# Patient Record
Sex: Female | Born: 1960 | Race: White | Hispanic: No | Marital: Married | Smoking: Never smoker
Health system: Southern US, Community
[De-identification: ages and names within clinical notes are randomized; demographics above are authoritative.]

## PROBLEM LIST (undated history)

## (undated) DIAGNOSIS — E785 Hyperlipidemia, unspecified: Secondary | ICD-10-CM

## (undated) DIAGNOSIS — K8020 Calculus of gallbladder without cholecystitis without obstruction: Secondary | ICD-10-CM

## (undated) DIAGNOSIS — K819 Cholecystitis, unspecified: Secondary | ICD-10-CM

## (undated) HISTORY — DX: Calculus of gallbladder without cholecystitis without obstruction: K80.20

## (undated) HISTORY — PX: CHOLECYSTECTOMY: SHX55

## (undated) HISTORY — DX: Hyperlipidemia, unspecified: E78.5

## (undated) HISTORY — DX: Cholecystitis, unspecified: K81.9

---

## 2006-01-18 ENCOUNTER — Ambulatory Visit: Payer: Self-pay

## 2007-03-14 ENCOUNTER — Ambulatory Visit: Payer: Self-pay

## 2008-03-17 ENCOUNTER — Ambulatory Visit: Payer: Self-pay

## 2008-07-16 ENCOUNTER — Ambulatory Visit: Payer: Self-pay | Admitting: Internal Medicine

## 2008-07-23 ENCOUNTER — Ambulatory Visit: Payer: Self-pay | Admitting: Internal Medicine

## 2008-08-27 ENCOUNTER — Ambulatory Visit: Payer: Self-pay | Admitting: Internal Medicine

## 2008-09-22 ENCOUNTER — Ambulatory Visit: Payer: Self-pay | Admitting: Internal Medicine

## 2009-03-18 ENCOUNTER — Ambulatory Visit: Payer: Self-pay

## 2010-03-22 ENCOUNTER — Ambulatory Visit: Payer: Self-pay

## 2010-09-13 ENCOUNTER — Emergency Department (HOSPITAL_BASED_OUTPATIENT_CLINIC_OR_DEPARTMENT_OTHER): Admission: EM | Admit: 2010-09-13 | Discharge: 2010-09-13 | Payer: Self-pay | Admitting: Emergency Medicine

## 2010-09-13 ENCOUNTER — Ambulatory Visit: Payer: Self-pay | Admitting: Diagnostic Radiology

## 2010-09-30 ENCOUNTER — Ambulatory Visit: Payer: Self-pay | Admitting: Internal Medicine

## 2010-10-31 ENCOUNTER — Ambulatory Visit: Payer: Self-pay | Admitting: Surgery

## 2010-11-07 ENCOUNTER — Ambulatory Visit: Payer: Self-pay | Admitting: Surgery

## 2010-11-08 LAB — PATHOLOGY REPORT

## 2011-01-03 LAB — BASIC METABOLIC PANEL
BUN: 15 mg/dL (ref 6–23)
CO2: 23 mEq/L (ref 19–32)
Calcium: 10.1 mg/dL (ref 8.4–10.5)
Chloride: 105 mEq/L (ref 96–112)
Creatinine, Ser: 0.9 mg/dL (ref 0.4–1.2)
GFR calc Af Amer: >60 mL/min (ref >60)
GFR calc non Af Amer: >60 mL/min (ref >60)
Glucose, Bld: 85 mg/dL (ref 70–99)
Potassium: 4.2 mEq/L (ref 3.5–5.1)
Sodium: 145 mEq/L (ref 135–145)

## 2011-01-03 LAB — POCT CARDIAC MARKERS
CKMB, poc: <1 ng/mL — ABNORMAL LOW (ref 1.0–8.0)
Myoglobin, poc: 63.8 ng/mL (ref 12–200)
Troponin i, poc: <0.05 ng/mL (ref 0.00–0.09)

## 2011-01-03 LAB — DIFFERENTIAL
Basophils Absolute: 0.1 10*3/uL (ref 0.0–0.1)
Basophils Relative: 1 % (ref 0–1)
Eosinophils Absolute: 0 10*3/uL (ref 0.0–0.7)
Eosinophils Relative: 1 % (ref 0–5)
Lymphocytes Relative: 27 % (ref 12–46)
Lymphs Abs: 1.9 10*3/uL (ref 0.7–4.0)
Monocytes Absolute: 0.3 10*3/uL (ref 0.1–1.0)
Monocytes Relative: 4 % (ref 3–12)
Neutro Abs: 4.7 10*3/uL (ref 1.7–7.7)
Neutrophils Relative %: 68 % (ref 43–77)

## 2011-01-03 LAB — CBC
HCT: 38.3 % (ref 36.0–46.0)
Hemoglobin: 13.7 g/dL (ref 12.0–15.0)
MCH: 30.1 pg (ref 26.0–34.0)
MCHC: 35.8 g/dL (ref 30.0–36.0)
MCV: 84.1 fL (ref 78.0–100.0)
Platelets: 319 10*3/uL (ref 150–400)
RBC: 4.56 MIL/uL (ref 3.87–5.11)
RDW: 11.7 % (ref 11.5–15.5)
WBC: 7 10*3/uL (ref 4.0–10.5)

## 2011-01-03 LAB — D-DIMER, QUANTITATIVE (NOT AT ARMC): D-Dimer, Quant: 0.29 ug/mL-FEU (ref 0.00–0.48)

## 2011-02-09 IMAGING — CR DG CHOLANGIOGRAM OPERATIVE
1 series · 2 of 2 positions shown · non-contrast
Comparison: none

REASON FOR EXAM: cholelithiasis
COMMENTS:

[Series 6002: chole 2 · 2 of 2 slices shown]
[im 1/2]
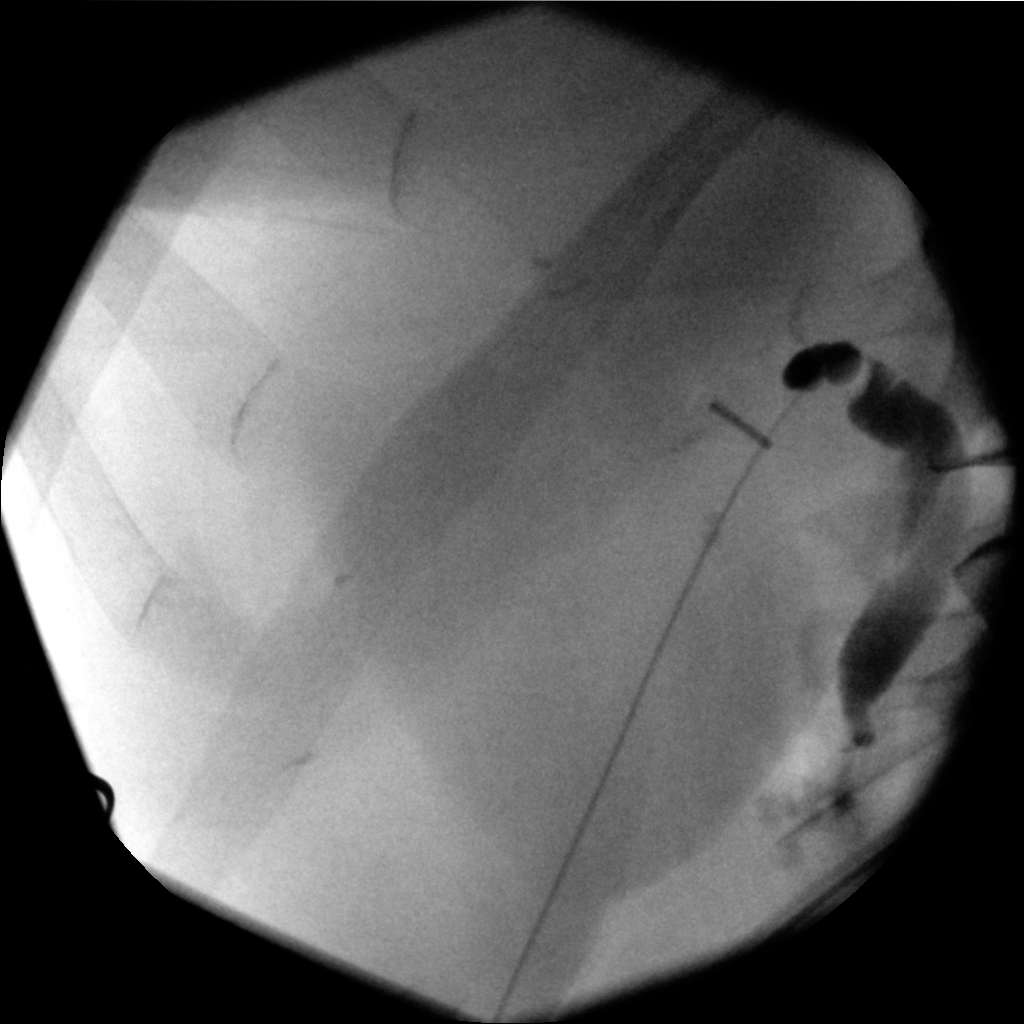
[im 2/2]
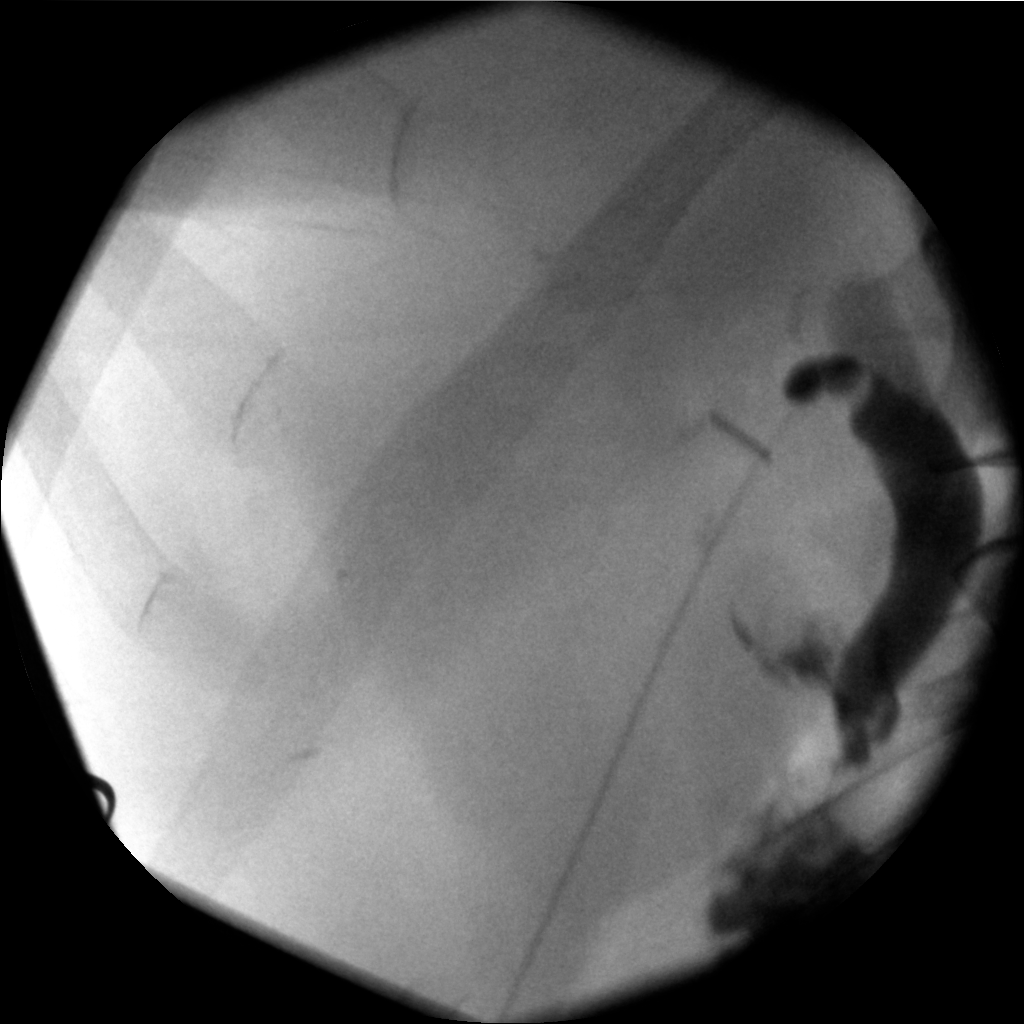

[2 of 2 positions shown; findings below may reference images not displayed]

PROCEDURE:     DXR - DXR CHOLANGIOGRAM OP (INITIAL)  - November 07, 2010  [DATE]

RESULT:     Four views of an operative cholangiogram are submitted. The
visualized portion of the common bile duct exhibits no filling defects. The
cystic duct remnant is grossly normal. I do not see the intrahepatic ducts.
There is contrast in the duodenum.
IMPRESSION: I do not see evidence of a retained common bile duct stone.

## 2011-03-22 ENCOUNTER — Ambulatory Visit: Payer: Self-pay | Admitting: Gastroenterology

## 2011-03-24 ENCOUNTER — Ambulatory Visit: Payer: Self-pay | Admitting: Internal Medicine

## 2011-09-18 ENCOUNTER — Ambulatory Visit: Payer: Self-pay | Admitting: Internal Medicine

## 2012-01-24 ENCOUNTER — Ambulatory Visit: Payer: Self-pay | Admitting: Internal Medicine

## 2012-04-22 ENCOUNTER — Ambulatory Visit: Payer: Self-pay | Admitting: Internal Medicine

## 2013-04-11 ENCOUNTER — Ambulatory Visit: Payer: Self-pay | Admitting: Internal Medicine

## 2013-05-13 ENCOUNTER — Ambulatory Visit: Payer: Self-pay

## 2014-06-08 ENCOUNTER — Ambulatory Visit: Payer: Self-pay

## 2016-06-21 ENCOUNTER — Other Ambulatory Visit: Payer: Self-pay | Admitting: Nurse Practitioner

## 2016-06-21 DIAGNOSIS — N63 Unspecified lump in unspecified breast: Secondary | ICD-10-CM

## 2016-06-21 DIAGNOSIS — Z1231 Encounter for screening mammogram for malignant neoplasm of breast: Secondary | ICD-10-CM

## 2016-06-22 ENCOUNTER — Other Ambulatory Visit: Payer: Self-pay | Admitting: *Deleted

## 2016-06-22 ENCOUNTER — Inpatient Hospital Stay
Admission: RE | Admit: 2016-06-22 | Discharge: 2016-06-22 | Disposition: A | Discharge status: Home / Self Care | Payer: Self-pay | Source: Ambulatory Visit | Attending: *Deleted | Admitting: *Deleted

## 2016-06-22 DIAGNOSIS — Z9289 Personal history of other medical treatment: Secondary | ICD-10-CM

## 2016-07-11 ENCOUNTER — Ambulatory Visit
Admission: RE | Admit: 2016-07-11 | Discharge: 2016-07-11 | Disposition: A | Discharge status: Home / Self Care | Payer: BLUE CROSS/BLUE SHIELD | Source: Ambulatory Visit | Attending: Nurse Practitioner | Admitting: Nurse Practitioner

## 2016-07-11 ENCOUNTER — Encounter: Payer: Self-pay | Admitting: Radiology

## 2016-07-11 DIAGNOSIS — Z1231 Encounter for screening mammogram for malignant neoplasm of breast: Secondary | ICD-10-CM

## 2016-07-11 DIAGNOSIS — N63 Unspecified lump in unspecified breast: Secondary | ICD-10-CM

## 2016-10-02 ENCOUNTER — Ambulatory Visit
Admission: RE | Admit: 2016-10-02 | Discharge: 2016-10-02 | Disposition: A | Discharge status: Home / Self Care | Payer: BLUE CROSS/BLUE SHIELD | Source: Ambulatory Visit | Attending: Nurse Practitioner | Admitting: Nurse Practitioner

## 2016-10-02 ENCOUNTER — Other Ambulatory Visit: Payer: Self-pay | Admitting: Nurse Practitioner

## 2016-10-02 DIAGNOSIS — M546 Pain in thoracic spine: Secondary | ICD-10-CM | POA: Insufficient documentation

## 2016-10-02 DIAGNOSIS — R52 Pain, unspecified: Secondary | ICD-10-CM

## 2016-10-30 DIAGNOSIS — M2550 Pain in unspecified joint: Secondary | ICD-10-CM | POA: Insufficient documentation

## 2016-10-30 DIAGNOSIS — R768 Other specified abnormal immunological findings in serum: Secondary | ICD-10-CM | POA: Insufficient documentation

## 2016-11-07 ENCOUNTER — Ambulatory Visit
Admission: RE | Admit: 2016-11-07 | Discharge: 2016-11-07 | Disposition: A | Discharge status: Home / Self Care | Payer: BLUE CROSS/BLUE SHIELD | Source: Ambulatory Visit | Attending: Nurse Practitioner | Admitting: Nurse Practitioner

## 2016-11-07 ENCOUNTER — Other Ambulatory Visit: Payer: Self-pay | Admitting: Nurse Practitioner

## 2016-11-07 DIAGNOSIS — R52 Pain, unspecified: Secondary | ICD-10-CM

## 2016-11-07 DIAGNOSIS — M545 Low back pain: Secondary | ICD-10-CM | POA: Diagnosis not present

## 2016-11-13 DIAGNOSIS — G8929 Other chronic pain: Secondary | ICD-10-CM | POA: Insufficient documentation

## 2016-11-13 DIAGNOSIS — M79645 Pain in left finger(s): Secondary | ICD-10-CM

## 2017-05-17 DIAGNOSIS — M1812 Unilateral primary osteoarthritis of first carpometacarpal joint, left hand: Secondary | ICD-10-CM | POA: Insufficient documentation

## 2017-07-05 DIAGNOSIS — M25532 Pain in left wrist: Secondary | ICD-10-CM

## 2017-07-05 DIAGNOSIS — G8929 Other chronic pain: Secondary | ICD-10-CM | POA: Insufficient documentation

## 2017-10-10 ENCOUNTER — Other Ambulatory Visit: Payer: Self-pay | Admitting: Nurse Practitioner

## 2017-10-10 DIAGNOSIS — F5101 Primary insomnia: Secondary | ICD-10-CM

## 2017-10-10 MED ORDER — ZOLPIDEM TARTRATE 10 MG PO TABS: 10.0000 mg | ORAL_TABLET | Freq: Every evening | ORAL | 2 refills | Status: DC | PRN

## 2017-10-10 NOTE — Progress Notes (Signed)
Refilled zolpidem with 2 refills and sent to her pharmacy.

## 2017-11-13 ENCOUNTER — Ambulatory Visit: Payer: Self-pay | Admitting: Nurse Practitioner

## 2017-12-25 ENCOUNTER — Encounter: Payer: Self-pay | Admitting: Nurse Practitioner

## 2017-12-25 ENCOUNTER — Ambulatory Visit: Payer: BLUE CROSS/BLUE SHIELD | Admitting: Nurse Practitioner

## 2017-12-25 VITALS — BP 122/78 | HR 88 | Resp 16 | Ht 70.0 in | Wt 195.8 lb

## 2017-12-25 DIAGNOSIS — Z1231 Encounter for screening mammogram for malignant neoplasm of breast: Secondary | ICD-10-CM

## 2017-12-25 DIAGNOSIS — F5101 Primary insomnia: Secondary | ICD-10-CM | POA: Diagnosis not present

## 2017-12-25 DIAGNOSIS — E782 Mixed hyperlipidemia: Secondary | ICD-10-CM

## 2017-12-25 DIAGNOSIS — M5000 Cervical disc disorder with myelopathy, unspecified cervical region: Secondary | ICD-10-CM | POA: Diagnosis not present

## 2017-12-25 DIAGNOSIS — Z1239 Encounter for other screening for malignant neoplasm of breast: Secondary | ICD-10-CM

## 2017-12-25 MED ORDER — ZOLPIDEM TARTRATE 10 MG PO TABS: 10.0000 mg | ORAL_TABLET | Freq: Every evening | ORAL | 3 refills | Status: DC | PRN

## 2017-12-25 NOTE — Progress Notes (Signed)
[facility] [address] [place], [place] [address]  Internal MEDICINE  Office Visit Note  Patient Name: Penny HeftyDebora A Hiemstra  [id number]  [id number]  Date of Service: 12/25/2017  Chief Complaint  Patient presents with  . Medication Refill    The patient is here for routine follow up exam. She has trouble with sleeping, intermittently. Will take ambien as needed when she has issues with sleep. She does need a refill of this today.  She continues to suffer from neck pain. She gets a massage every other week to control the pain. Has been on vacation this past week and the pain is well controlled. When needed, she does take anti-inflammatory or occasionally, se will take hydrocodone/APAP 5/325mg  tablets if needed. This is generally after long hours working.    Pt is here for routine follow up.    Current Medication: Outpatient Encounter Medications as of 12/25/2017  Medication Sig  . Calcium Citrate 200 MG TABS Take by mouth.  . cyclobenzaprine (FLEXERIL) 10 MG tablet Take by mouth.  . estrogen, conjugated,-medroxyprogesterone (PREMPRO) 0.3-1.5 MG tablet   . furosemide (LASIX) 20 MG tablet TAKE 1 DAILY AS NEEDED  . HYDROcodone-acetaminophen (NORCO/VICODIN) 5-325 MG tablet Take 1 tablet by mouth every 6 (six) hours as needed for moderate pain.  Marland Kitchen. ibuprofen (ADVIL,MOTRIN) 200 MG tablet Take by mouth.  . Multiple Vitamin (MULTI-VITAMINS) TABS Take by mouth.  Marland Kitchen. omeprazole (PRILOSEC) 20 MG capsule Take by mouth.  . Phendimetrazine Tartrate 105 MG CP24 Take by mouth.  . pravastatin (PRAVACHOL) 20 MG tablet Take by mouth.  . zolpidem (AMBIEN) 10 MG tablet Take 1 tablet (10 mg total) by mouth at bedtime as needed for sleep.  . [DISCONTINUED] zolpidem (AMBIEN) 10 MG tablet Take 1 tablet (10 mg total) by mouth at bedtime as needed for sleep.   No facility-administered encounter medications on file as of 12/25/2017.     Surgical History: Past Surgical History:  Procedure Laterality  Date  . CHOLECYSTECTOMY      Medical History: Past Medical History:  Diagnosis Date  . Cholecystitis   . Cholelithiasis   . Hyperlipidemia     Family History: Family History  Problem Relation Age of Onset  . Thyroid disease Mother   . Hyperlipidemia Father   . Heart disease Father   . Thyroid disease Maternal Grandmother     Social History   Socioeconomic History  . Marital status: Married    Spouse name: Not on file  . Number of children: Not on file  . Years of education: Not on file  . Highest education level: Not on file  Social Needs  . Financial resource strain: Not on file  . Food insecurity - worry: Not on file  . Food insecurity - inability: Not on file  . Transportation needs - medical: Not on file  . Transportation needs - non-medical: Not on file  Occupational History  . Not on file  Tobacco Use  . Smoking status: Never Smoker  . Smokeless tobacco: Never Used  Substance and Sexual Activity  . Alcohol use: No    Frequency: Never  . Drug use: No  . Sexual activity: Not on file  Other Topics Concern  . Not on file  Social History Narrative  . Not on file      Review of Systems  Constitutional: Negative for activity change, chills, fatigue and unexpected weight change.  HENT: Negative for congestion, postnasal drip, rhinorrhea, sneezing and sore throat.   Eyes: Negative.  Negative for redness.  Respiratory: Negative for cough, chest tightness and shortness of breath.   Cardiovascular: Negative for chest pain and palpitations.  Gastrointestinal: Negative for abdominal pain, constipation, diarrhea, nausea and vomiting.  Endocrine: Negative for cold intolerance, heat intolerance, polydipsia, polyphagia and polyuria.  Genitourinary: Negative for dysuria and frequency.  Musculoskeletal: Positive for neck pain. Negative for arthralgias, back pain and joint swelling.  Skin: Negative for rash.  Neurological: Negative.  Negative for tremors and numbness.   Hematological: Negative for adenopathy. Does not bruise/bleed easily.  Psychiatric/Behavioral: Positive for sleep disturbance. Negative for behavioral problems (Depression) and suicidal ideas. The patient is not nervous/anxious.     Today's Vitals   12/25/17 1020  BP: 122/78  Pulse: 88  Resp: 16  SpO2: 99%  Weight: 195 lb 12.8 oz (88.8 kg)  Height: 5\' 10"  (1.778 m)    Physical Exam  Constitutional: She is oriented to person, place, and time. She appears well-developed and well-nourished. No distress.  HENT:  Head: Normocephalic and atraumatic.  Mouth/Throat: Oropharynx is clear and moist. No oropharyngeal exudate.  Eyes: Pupils are equal, round, and reactive to light. EOM are normal.  Neck: Normal range of motion. Neck supple. No JVD present. No tracheal deviation present. No thyromegaly present.  Cardiovascular: Normal rate, regular rhythm and normal heart sounds. Exam reveals no gallop and no friction rub.  No murmur heard. Pulmonary/Chest: Effort normal and breath sounds normal. No respiratory distress. She has no wheezes. She has no rales. She exhibits no tenderness.  Abdominal: Soft. Bowel sounds are normal. There is no tenderness.  Musculoskeletal: Normal range of motion.  Lymphadenopathy:    She has no cervical adenopathy.  Neurological: She is alert and oriented to person, place, and time. No cranial nerve deficit.  Skin: Skin is warm and dry. She is not diaphoretic.  Psychiatric: She has a normal mood and affect. Her behavior is normal. Judgment and thought content normal.  Nursing note and vitals reviewed.  Assessment/Plan: 1. Cervical disc disease with myelopathy Overal, doing well. Continue to take NSAIDs as needed and as indicated to control pain and inflammation. May continue to take hydrocodone/APAP 5/325mg  tablets at night if needed for severe pain.   2. Primary insomnia - zolpidem (AMBIEN) 10 MG tablet; Take 1 tablet (10 mg total) by mouth at bedtime as needed  for sleep.  Dispense: 30 tablet; Refill: 3  3. Mixed hyperlipidemia Stable. Continue pravastatin as prescribed.   4. Screening for breast cancer - MM DIGITAL SCREENING BILATERAL; Future  General Counseling: Joslynne verbalizes understanding of the findings of todays visit and agrees with plan of treatment. I have discussed any further diagnostic evaluation that may be needed or ordered today. We also reviewed her medications today. she has been encouraged to call the office with any questions or concerns that should arise related to todays visit.   This patient was seen by Vincent Gros, FNP- C in Collaboration with Dr Lyndon Code as a part of collaborative care agreement    Orders Placed This Encounter  Procedures  . MM DIGITAL SCREENING BILATERAL    Meds ordered this encounter  Medications  . zolpidem (AMBIEN) 10 MG tablet    Sig: Take 1 tablet (10 mg total) by mouth at bedtime as needed for sleep.    Dispense:  30 tablet    Refill:  3    Order Specific Question:   Supervising Provider    Answer:   Lyndon Code [[id number]]    Time  spent: 26  Minutes     Dr Lyndon Code Internal medicine

## 2018-01-07 ENCOUNTER — Telehealth: Payer: Self-pay

## 2018-01-07 NOTE — Telephone Encounter (Signed)
[facility] [place] sent [place] fax to refill pt zolpidem 10 mg tab, but rx was already sent 12/25/17. Pharmacy was called and notified and the mistake was on their end. The refill was put under Penny Hensley. Spoke to a representative and she said it will be fixed.

## 2018-01-13 DIAGNOSIS — Z0001 Encounter for general adult medical examination with abnormal findings: Secondary | ICD-10-CM | POA: Insufficient documentation

## 2018-01-13 DIAGNOSIS — F5101 Primary insomnia: Secondary | ICD-10-CM | POA: Insufficient documentation

## 2018-01-13 DIAGNOSIS — M5000 Cervical disc disorder with myelopathy, unspecified cervical region: Secondary | ICD-10-CM | POA: Insufficient documentation

## 2018-01-13 DIAGNOSIS — E782 Mixed hyperlipidemia: Secondary | ICD-10-CM | POA: Insufficient documentation

## 2018-02-25 ENCOUNTER — Other Ambulatory Visit: Payer: Self-pay | Admitting: Sports Medicine

## 2018-02-25 DIAGNOSIS — M25532 Pain in left wrist: Principal | ICD-10-CM

## 2018-02-25 DIAGNOSIS — G8929 Other chronic pain: Secondary | ICD-10-CM

## 2018-03-01 ENCOUNTER — Ambulatory Visit: Payer: BLUE CROSS/BLUE SHIELD

## 2018-03-05 ENCOUNTER — Ambulatory Visit: Payer: BLUE CROSS/BLUE SHIELD

## 2018-03-15 ENCOUNTER — Ambulatory Visit
Admission: RE | Admit: 2018-03-15 | Discharge: 2018-03-15 | Disposition: A | Discharge status: Home / Self Care | Payer: BLUE CROSS/BLUE SHIELD | Source: Ambulatory Visit | Attending: Sports Medicine | Admitting: Sports Medicine

## 2018-03-15 ENCOUNTER — Ambulatory Visit: Payer: BLUE CROSS/BLUE SHIELD

## 2018-03-15 DIAGNOSIS — M24132 Other articular cartilage disorders, left wrist: Secondary | ICD-10-CM | POA: Insufficient documentation

## 2018-03-15 DIAGNOSIS — M25532 Pain in left wrist: Secondary | ICD-10-CM | POA: Diagnosis present

## 2018-03-15 DIAGNOSIS — G8929 Other chronic pain: Secondary | ICD-10-CM | POA: Insufficient documentation

## 2018-03-15 MED ORDER — LIDOCAINE HCL (PF) 1 % IJ SOLN
5.0000 mL | Freq: Once | INTRAMUSCULAR | Status: AC
  Administered 2018-03-15: 5 mL

## 2018-03-15 MED ORDER — IOPAMIDOL (ISOVUE-300) INJECTION 61%
10.0000 mL | Freq: Once | INTRAVENOUS | Status: AC | PRN
  Administered 2018-03-15: 1.5 mL

## 2018-03-15 MED ORDER — GADOBENATE DIMEGLUMINE 529 MG/ML IV SOLN
5.0000 mL | Freq: Once | INTRAVENOUS | Status: AC | PRN
  Administered 2018-03-15: 0.05 mL via INTRA_ARTICULAR

## 2018-04-15 DIAGNOSIS — M24132 Other articular cartilage disorders, left wrist: Secondary | ICD-10-CM | POA: Insufficient documentation

## 2018-04-15 DIAGNOSIS — M778 Other enthesopathies, not elsewhere classified: Secondary | ICD-10-CM | POA: Insufficient documentation

## 2018-04-29 ENCOUNTER — Encounter (INDEPENDENT_AMBULATORY_CARE_PROVIDER_SITE_OTHER): Payer: Self-pay

## 2018-04-29 ENCOUNTER — Encounter: Payer: Self-pay | Admitting: Nurse Practitioner

## 2018-04-29 ENCOUNTER — Ambulatory Visit: Payer: Self-pay | Admitting: Nurse Practitioner

## 2018-04-29 VITALS — BP 122/72 | HR 64 | Resp 16 | Ht 70.0 in | Wt 200.6 lb

## 2018-04-29 DIAGNOSIS — E669 Obesity, unspecified: Secondary | ICD-10-CM | POA: Insufficient documentation

## 2018-04-29 DIAGNOSIS — M5000 Cervical disc disorder with myelopathy, unspecified cervical region: Secondary | ICD-10-CM

## 2018-04-29 DIAGNOSIS — F5101 Primary insomnia: Secondary | ICD-10-CM

## 2018-04-29 DIAGNOSIS — E782 Mixed hyperlipidemia: Secondary | ICD-10-CM

## 2018-04-29 MED ORDER — PHENDIMETRAZINE TARTRATE ER 105 MG PO CP24: 105.0000 mg | ORAL_CAPSULE | Freq: Every day | ORAL | 2 refills | Status: DC

## 2018-04-29 MED ORDER — ZOLPIDEM TARTRATE 10 MG PO TABS: 10.0000 mg | ORAL_TABLET | Freq: Every evening | ORAL | 3 refills | Status: DC | PRN

## 2018-04-29 NOTE — Progress Notes (Signed)
[facility] [address] [place], [place] [address]  Internal MEDICINE  Office Visit Note  Patient Name: Penny Hensley  [id number]  [id number]  Date of Service: 04/29/2018  Chief Complaint  Patient presents with  . MYELOPATHY    4 month follow up    The patient has continued left wrist. Has seen orthopedics. Will need to have surgery to repair torn ligament in the wrist. Also continues to have neck pain along the right side of the neck. Does see massage therapist every other week. This is helping to control the pain and muscle aches. Has not had to use any pain medications other than ibuprofen in several months.  Has gained about 5 pounds since her last visit. Has been off appetite suppressant for some time. Just came off vacation. Would liek to restart Bontril to help get the new weight off.    Pt is here for routine follow up.    Current Medication: Outpatient Encounter Medications as of 04/29/2018  Medication Sig  . Calcium Citrate 200 MG TABS Take by mouth.  . cyclobenzaprine (FLEXERIL) 10 MG tablet Take by mouth.  . estrogen, conjugated,-medroxyprogesterone (PREMPRO) 0.3-1.5 MG tablet   . furosemide (LASIX) 20 MG tablet TAKE 1 DAILY AS NEEDED  . HYDROcodone-acetaminophen (NORCO/VICODIN) 5-325 MG tablet Take 1 tablet by mouth every 6 (six) hours as needed for moderate pain.  Marland Kitchen ibuprofen (ADVIL,MOTRIN) 200 MG tablet Take by mouth.  . Multiple Vitamin (MULTI-VITAMINS) TABS Take by mouth.  Marland Kitchen omeprazole (PRILOSEC) 20 MG capsule Take by mouth.  . Phendimetrazine Tartrate 105 MG CP24 Take 1 capsule (105 mg total) by mouth daily.  . pravastatin (PRAVACHOL) 20 MG tablet Take by mouth.  . [DISCONTINUED] Phendimetrazine Tartrate 105 MG CP24 Take by mouth.  . zolpidem (AMBIEN) 10 MG tablet Take 1 tablet (10 mg total) by mouth at bedtime as needed for sleep.  . [DISCONTINUED] zolpidem (AMBIEN) 10 MG tablet Take 1 tablet (10 mg total) by mouth at bedtime as needed for  sleep.   No facility-administered encounter medications on file as of 04/29/2018.     Surgical History: Past Surgical History:  Procedure Laterality Date  . CHOLECYSTECTOMY      Medical History: Past Medical History:  Diagnosis Date  . Cholecystitis   . Cholelithiasis   . Hyperlipidemia     Family History: Family History  Problem Relation Age of Onset  . Thyroid disease Mother   . Hyperlipidemia Father   . Heart disease Father   . Thyroid disease Maternal Grandmother     Social History   Socioeconomic History  . Marital status: Married    Spouse name: Not on file  . Number of children: Not on file  . Years of education: Not on file  . Highest education level: Not on file  Occupational History  . Not on file  Social Needs  . Financial resource strain: Not on file  . Food insecurity:    Worry: Not on file    Inability: Not on file  . Transportation needs:    Medical: Not on file    Non-medical: Not on file  Tobacco Use  . Smoking status: Never Smoker  . Smokeless tobacco: Never Used  Substance and Sexual Activity  . Alcohol use: No    Frequency: Never  . Drug use: No  . Sexual activity: Not on file  Lifestyle  . Physical activity:    Days per week: Not on file    Minutes per session:  Not on file  . Stress: Not on file  Relationships  . Social connections:    Talks on phone: Not on file    Gets together: Not on file    Attends religious service: Not on file    Active member of club or organization: Not on file    Attends meetings of clubs or organizations: Not on file    Relationship status: Not on file  . Intimate partner violence:    Fear of current or ex partner: Not on file    Emotionally abused: Not on file    Physically abused: Not on file    Forced sexual activity: Not on file  Other Topics Concern  . Not on file  Social History Narrative  . Not on file      Review of Systems  Constitutional: Positive for unexpected weight change.  Negative for activity change, chills and fatigue.       Five pound weight gain since last visit.   HENT: Negative for congestion, postnasal drip, rhinorrhea, sneezing and sore throat.   Eyes: Negative.  Negative for redness.  Respiratory: Negative for cough, chest tightness, shortness of breath and wheezing.   Cardiovascular: Negative for chest pain and palpitations.  Gastrointestinal: Negative for abdominal pain, constipation, diarrhea, nausea and vomiting.  Endocrine: Negative for cold intolerance, heat intolerance, polydipsia, polyphagia and polyuria.  Genitourinary: Negative for dysuria and frequency.  Musculoskeletal: Positive for arthralgias, neck pain and neck stiffness. Negative for back pain and joint swelling.       Left wrist pain.   Skin: Negative for rash.  Allergic/Immunologic: Negative for environmental allergies.  Neurological: Negative for dizziness, tremors, numbness and headaches.  Hematological: Negative for adenopathy. Does not bruise/bleed easily.  Psychiatric/Behavioral: Positive for sleep disturbance. Negative for behavioral problems (Depression) and suicidal ideas. The patient is not nervous/anxious.     Today's Vitals   04/29/18 0907  BP: 122/72  Pulse: 64  Resp: 16  SpO2: 100%  Weight: 200 lb 9.6 oz (91 kg)  Height: 5\' 10"  (1.778 m)    Physical Exam  Constitutional: She is oriented to person, place, and time. She appears well-developed and well-nourished. No distress.  HENT:  Head: Normocephalic and atraumatic.  Mouth/Throat: Oropharynx is clear and moist. No oropharyngeal exudate.  Eyes: Pupils are equal, round, and reactive to light. EOM are normal.  Neck: Normal range of motion and full passive range of motion without pain. Neck supple. No JVD present. No tracheal deviation present. No thyromegaly present.  Cardiovascular: Normal rate, regular rhythm and normal heart sounds. Exam reveals no gallop and no friction rub.  No murmur  heard. Pulmonary/Chest: Effort normal and breath sounds normal. No respiratory distress. She has no wheezes. She has no rales. She exhibits no tenderness.  Abdominal: Soft. Bowel sounds are normal. There is no tenderness.  Musculoskeletal:  Limited ROM and strength of left wrist. Mild swelling present.   Lymphadenopathy:    She has no cervical adenopathy.  Neurological: She is alert and oriented to person, place, and time. No cranial nerve deficit.  Skin: Skin is warm and dry. She is not diaphoretic.  Psychiatric: She has a normal mood and affect. Her behavior is normal. Judgment and thought content normal.  Nursing note and vitals reviewed.  Assessment/Plan: 1. Mixed hyperlipidemia Fasting lipid panel to be checked prior to next visit.   2. Cervical disc disease with myelopathy Continue regular visits with massage therapy every 2 weeks. NSAIDs as needed.   3. Primary  insomnia - zolpidem (AMBIEN) 10 MG tablet; Take 1 tablet (10 mg total) by mouth at bedtime as needed for sleep.  Dispense: 30 tablet; Refill: 3  4. Mild obesity Restart bontril SR 105mg  daily. Reviewed importance of low calorie diet and regular exercise while trying to lose weight.  - Phendimetrazine Tartrate 105 MG CP24; Take 1 capsule (105 mg total) by mouth daily.  Dispense: 30 each; Refill: 2  General Counseling: Landis verbalizes understanding of the findings of todays visit and agrees with plan of treatment. I have discussed any further diagnostic evaluation that may be needed or ordered today. We also reviewed her medications today. she has been encouraged to call the office with any questions or concerns that should arise related to todays visit.    Counseling:   There is a liability release in patients' chart. There has been a 10 minute discussion about the side effects including but not limited to elevated blood pressure, anxiety, lack of sleep and dry mouth. Pt understands and will like to start/continue on  appetite suppressant at this time. There will be one month RX given at the time of visit with proper follow up. Nova diet plan with restricted calories is given to the pt. Pt understands and agrees with  plan of treatment  This patient was seen by Vincent Gros, FNP- C in Collaboration with Dr Lyndon Code as a part of collaborative care agreement    Meds ordered this encounter  Medications  . zolpidem (AMBIEN) 10 MG tablet    Sig: Take 1 tablet (10 mg total) by mouth at bedtime as needed for sleep.    Dispense:  30 tablet    Refill:  3    Order Specific Question:   Supervising Provider    Answer:   Lyndon Code [[id number]]  . Phendimetrazine Tartrate 105 MG CP24    Sig: Take 1 capsule (105 mg total) by mouth daily.    Dispense:  30 each    Refill:  2    Order Specific Question:   Supervising Provider    Answer:   Lyndon Code [[id number]]    Time spent: 61 Minutes      Dr Lyndon Code Internal medicine

## 2018-05-20 DIAGNOSIS — R200 Anesthesia of skin: Secondary | ICD-10-CM | POA: Insufficient documentation

## 2018-05-20 DIAGNOSIS — R2241 Localized swelling, mass and lump, right lower limb: Secondary | ICD-10-CM | POA: Insufficient documentation

## 2018-05-26 ENCOUNTER — Other Ambulatory Visit: Payer: Self-pay | Admitting: Internal Medicine

## 2018-06-02 ENCOUNTER — Other Ambulatory Visit: Payer: Self-pay | Admitting: Internal Medicine

## 2018-09-03 ENCOUNTER — Encounter: Payer: Self-pay | Admitting: Nurse Practitioner

## 2018-10-30 ENCOUNTER — Encounter: Payer: Self-pay | Admitting: Nurse Practitioner

## 2018-10-31 ENCOUNTER — Encounter: Payer: Self-pay | Admitting: Nurse Practitioner

## 2018-10-31 ENCOUNTER — Other Ambulatory Visit: Payer: Self-pay

## 2018-10-31 ENCOUNTER — Ambulatory Visit (INDEPENDENT_AMBULATORY_CARE_PROVIDER_SITE_OTHER): Payer: BC Managed Care – PPO | Admitting: Nurse Practitioner

## 2018-10-31 VITALS — BP 120/78 | HR 64 | Resp 16 | Ht 70.0 in | Wt 176.2 lb

## 2018-10-31 DIAGNOSIS — R300 Dysuria: Secondary | ICD-10-CM

## 2018-10-31 DIAGNOSIS — M5000 Cervical disc disorder with myelopathy, unspecified cervical region: Secondary | ICD-10-CM

## 2018-10-31 DIAGNOSIS — R600 Localized edema: Secondary | ICD-10-CM

## 2018-10-31 DIAGNOSIS — Z0001 Encounter for general adult medical examination with abnormal findings: Secondary | ICD-10-CM

## 2018-10-31 DIAGNOSIS — N281 Cyst of kidney, acquired: Secondary | ICD-10-CM

## 2018-10-31 MED ORDER — IBUPROFEN 200 MG PO TABS: 800.0000 mg | ORAL_TABLET | Freq: Three times a day (TID) | ORAL | 3 refills | Status: DC | PRN

## 2018-10-31 MED ORDER — FUROSEMIDE 20 MG PO TABS: 20.0000 mg | ORAL_TABLET | ORAL | 5 refills | Status: DC | PRN

## 2018-10-31 MED ORDER — FUROSEMIDE 20 MG PO TABS: 20.0000 mg | ORAL_TABLET | Freq: Every day | ORAL | 5 refills | Status: DC | PRN

## 2018-10-31 NOTE — Progress Notes (Signed)
[facility] [address] [place], [place] [address]  Internal MEDICINE  Office Visit Note  Patient Name: Penny HeftyDebora A Hensley  [id number] 16, 2062  [id number]  Date of Service: 11/02/2018   Pt is here for routine health maintenance examination   Chief Complaint  Patient presents with  . Annual Exam    pt has cyst on kidney it has been bothering her  . Numbness    still have numbness in her toes  . Quality Metric Gaps    pt stated she got her mammogram done march 2019     The patient is here for routine health maintenance exam. She is having worsening right flank pain, especially with exertion or standing and sitting still for long periods of time. She does have a small renal cyst on right kidney, and this is exact spot where she is having pain. This started getting worse in October and November and has continued to worsen. States that by the end of the day, it hurts her to move or to sit. After she goes to bed and wakes up, the pain is completely gone. Currently, has no pain. She also has continued to have numbness in her toes. Numbness is in the very tips of her toes and started last year or the year before. Has not really changed. She does not wear high heels. Generally wears tennis shoes with arch supports to work.    Current Medication: Outpatient Encounter Medications as of 10/31/2018  Medication Sig  . B Complex Vitamins (B COMPLEX PO) Take by mouth.  . fluocinonide cream (LIDEX) 0.05 % Apply 1 application topically 2 (two) times daily.  Marland Kitchen. ibuprofen (ADVIL,MOTRIN) 200 MG tablet Take 4 tablets (800 mg total) by mouth every 8 (eight) hours as needed.  . Multiple Vitamin (MULTI-VITAMINS) TABS Take by mouth.  Marland Kitchen. omeprazole (PRILOSEC) 20 MG capsule TAKE ONE CAPSULE BY MOUTH EVERY DAY  . pravastatin (PRAVACHOL) 20 MG tablet TAKE 1 TABLET BY MOUTH EVERY DAY  . progesterone (PROMETRIUM) 100 MG capsule Take 100 mg by mouth daily.  . [DISCONTINUED] furosemide (LASIX) 20 MG tablet  TAKE 1 DAILY AS NEEDED  . [DISCONTINUED] furosemide (LASIX) 20 MG tablet Take 1 tablet (20 mg total) by mouth as needed.  . [DISCONTINUED] ibuprofen (ADVIL,MOTRIN) 200 MG tablet Take 800 mg by mouth as needed.   . Calcium Citrate 200 MG TABS Take by mouth.  . cyclobenzaprine (FLEXERIL) 10 MG tablet Take by mouth.  . estrogen, conjugated,-medroxyprogesterone (PREMPRO) 0.3-1.5 MG tablet   . [DISCONTINUED] HYDROcodone-acetaminophen (NORCO/VICODIN) 5-325 MG tablet Take 1 tablet by mouth every 6 (six) hours as needed for moderate pain.  . [DISCONTINUED] Phendimetrazine Tartrate 105 MG CP24 Take 1 capsule (105 mg total) by mouth daily. (Patient not taking: Reported on 10/31/2018)  . [DISCONTINUED] zolpidem (AMBIEN) 10 MG tablet Take 1 tablet (10 mg total) by mouth at bedtime as needed for sleep.   No facility-administered encounter medications on file as of 10/31/2018.     Surgical History: Past Surgical History:  Procedure Laterality Date  . CHOLECYSTECTOMY      Medical History: Past Medical History:  Diagnosis Date  . Cholecystitis   . Cholelithiasis   . Hyperlipidemia     Family History: Family History  Problem Relation Age of Onset  . Thyroid disease Mother   . Hyperlipidemia Father   . Heart disease Father   . Thyroid disease Maternal Grandmother       Review of Systems  Constitutional: Negative for chills, fatigue and  unexpected weight change.       Patient has had 24 pound weight loss since her last visit. [facility] and taking Hcg shots to help with weight loss. Is doing well.   HENT: Negative for congestion, postnasal drip, rhinorrhea, sneezing and sore throat.   Respiratory: Negative for cough, chest tightness, shortness of breath and wheezing.   Cardiovascular: Negative for chest pain and palpitations.  Gastrointestinal: Negative for abdominal pain, constipation, diarrhea, nausea and vomiting.  Endocrine: Negative for cold intolerance, heat intolerance,  polydipsia and polyuria.  Genitourinary: Positive for flank pain. Negative for dysuria, frequency and urgency.       History of right renal cyst.   Musculoskeletal: Negative for arthralgias, back pain, joint swelling and neck pain.  Skin: Negative for rash.  Allergic/Immunologic: Negative for environmental allergies.  Neurological: Negative for dizziness, tremors, numbness and headaches.  Hematological: Negative for adenopathy. Does not bruise/bleed easily.  Psychiatric/Behavioral: Negative for behavioral problems (Depression), dysphoric mood, sleep disturbance and suicidal ideas. The patient is not nervous/anxious.      Today's Vitals   10/31/18 1015  BP: 120/78  Pulse: 64  Resp: 16  SpO2: 98%  Weight: 176 lb 3.2 oz (79.9 kg)  Height: 5\' 10"  (1.778 m)    Physical Exam Vitals signs and nursing note reviewed.  Constitutional:      General: She is not in acute distress.    Appearance: Normal appearance. She is well-developed. She is not diaphoretic.  HENT:     Head: Normocephalic and atraumatic.     Nose: Nose normal.     Mouth/Throat:     Pharynx: No oropharyngeal exudate.  Eyes:     Extraocular Movements: Extraocular movements intact.     Conjunctiva/sclera: Conjunctivae normal.     Pupils: Pupils are equal, round, and reactive to light.  Neck:     Musculoskeletal: Normal range of motion and neck supple. Muscular tenderness present.     Thyroid: No thyromegaly.     Vascular: No JVD.     Trachea: No tracheal deviation.  Cardiovascular:     Rate and Rhythm: Normal rate and regular rhythm.     Pulses: Normal pulses.     Heart sounds: Normal heart sounds. No murmur. No friction rub. No gallop.   Pulmonary:     Effort: Pulmonary effort is normal. No respiratory distress.     Breath sounds: Normal breath sounds. No wheezing or rales.  Chest:     Chest wall: No tenderness.     Breasts:        Right: Normal. No swelling, bleeding, inverted nipple, mass, nipple discharge,  skin change or tenderness.        Left: Normal. No swelling, bleeding, inverted nipple, mass, nipple discharge, skin change or tenderness.  Abdominal:     General: Abdomen is flat. Bowel sounds are normal.     Palpations: Abdomen is soft.  Genitourinary:    Comments: No CVA tenderness with palpation. Musculoskeletal: Normal range of motion.  Lymphadenopathy:     Cervical: No cervical adenopathy.  Skin:    General: Skin is warm and dry.     Capillary Refill: Capillary refill takes less than 2 seconds.  Neurological:     Mental Status: She is alert and oriented to person, place, and time.     Cranial Nerves: No cranial nerve deficit.  Psychiatric:        Mood and Affect: Mood normal.        Behavior:  Behavior normal.        Thought Content: Thought content normal.        Judgment: Judgment normal.      LABS: Recent Results (from the past 2160 hour(s))  UA/M w/rflx Culture, Routine     Status: Abnormal   Collection Time: 10/31/18 10:34 AM  Result Value Ref Range   Specific Gravity, UA 1.022 1.005 - 1.030   pH, UA 6.5 5.0 - 7.5   Color, UA Yellow Yellow   Appearance Ur Clear Clear   Leukocytes, UA Trace (A) Negative   Protein, UA Negative Negative/Trace   Glucose, UA Negative Negative   Ketones, UA Negative Negative   RBC, UA Negative Negative   Bilirubin, UA Negative Negative   Urobilinogen, Ur 1.0 0.2 - 1.0 mg/dL   Nitrite, UA Negative Negative   Microscopic Examination See below:     Comment: Microscopic was indicated and was performed.   Urinalysis Reflex Comment     Comment: This specimen has reflexed to a Urine Culture.  Microscopic Examination     Status: None   Collection Time: 10/31/18 10:34 AM  Result Value Ref Range   WBC, UA 0-5 0 - 5 /hpf   RBC, UA 0-2 0 - 2 /hpf   Epithelial Cells (non renal) 0-10 0 - 10 /hpf   Casts None seen None seen /lpf   Mucus, UA Present Not Estab.   Bacteria, UA Few None seen/Few  Urine Culture, Reflex     Status: Abnormal    Collection Time: 10/31/18 10:34 AM  Result Value Ref Range   Urine Culture, Routine CANCELED (A)     Comment: Test Not Performed. One specimen was submitted with requests for multiple tests. The requested testing requires a separate specimen for each test requested.  Result canceled by the ancillary.    Assessment/Plan: 1. Encounter for general adult medical examination with abnormal findings Annual health maintenance exam today.  2. Acquired renal cyst of right kidney Repeat ultrasound of kidneys for further evaluation. - US Renal; Future  3. Cervical disc disease with myelopathy May continue ibuprofen 800mg  up to three times daily if needed for pain/inflammation. Refill provided today.  - ibuprofen (ADVIL,MOTRIN) 200 MG tablet; Take 4 tablets (800 mg total) by mouth every 8 (eight) hours as needed.  Dispense: 90 tablet; Refill: 3  4. Edema leg May take HCTZ as needed and as prescribed.   5. Dysuria - UA/M w/rflx Culture, Routine  General Counseling: Kambree verbalizes understanding of the findings of todays visit and agrees with plan of treatment. I have discussed any further diagnostic evaluation that may be needed or ordered today. We also reviewed her medications today. she has been encouraged to call the office with any questions or concerns that should arise related to todays visit.    Counseling:  This patient was seen by Vincent GrosHeather Joycelyn Liska FNP Collaboration with Dr Lyndon CodeFozia M Khan as a part of collaborative care agreement  Orders Placed This Encounter  Procedures  . Microscopic Examination  . Urine Culture, Reflex  . US Renal  . UA/M w/rflx Culture, Routine    Meds ordered this encounter  Medications  . ibuprofen (ADVIL,MOTRIN) 200 MG tablet    Sig: Take 4 tablets (800 mg total) by mouth every 8 (eight) hours as needed.    Dispense:  90 tablet    Refill:  3    Order Specific Question:   Supervising Provider    Answer:   Lyndon CodeKHAN, FOZIA M [[id number]]  .  DISCONTD: furosemide  (LASIX) 20 MG tablet    Sig: Take 1 tablet (20 mg total) by mouth as needed.    Dispense:  30 tablet    Refill:  5    Order Specific Question:   Supervising Provider    Answer:   Lyndon Code [[id number]]    Time spent: 39 Minutes      Lyndon Code, MD  Internal Medicine

## 2018-11-01 ENCOUNTER — Ambulatory Visit (INDEPENDENT_AMBULATORY_CARE_PROVIDER_SITE_OTHER): Payer: BC Managed Care – PPO

## 2018-11-01 DIAGNOSIS — N281 Cyst of kidney, acquired: Secondary | ICD-10-CM

## 2018-11-02 DIAGNOSIS — R300 Dysuria: Secondary | ICD-10-CM | POA: Insufficient documentation

## 2018-11-02 DIAGNOSIS — R600 Localized edema: Secondary | ICD-10-CM | POA: Insufficient documentation

## 2018-11-02 DIAGNOSIS — N281 Cyst of kidney, acquired: Secondary | ICD-10-CM | POA: Insufficient documentation

## 2018-11-02 LAB — UA/M W/RFLX CULTURE, ROUTINE
Bilirubin, UA: NEGATIVE
Glucose, UA: NEGATIVE
Ketones, UA: NEGATIVE
Nitrite, UA: NEGATIVE
Protein, UA: NEGATIVE
RBC, UA: NEGATIVE
Specific Gravity, UA: 1.022 (ref 1.005–1.030)
Urobilinogen, Ur: 1 mg/dL (ref 0.2–1.0)
pH, UA: 6.5 (ref 5.0–7.5)

## 2018-11-02 LAB — MICROSCOPIC EXAMINATION: Casts: NONE SEEN /lpf

## 2018-11-02 LAB — URINE CULTURE, REFLEX

## 2018-11-08 ENCOUNTER — Encounter: Payer: Self-pay | Admitting: Nurse Practitioner

## 2018-11-08 ENCOUNTER — Ambulatory Visit: Payer: BC Managed Care – PPO | Admitting: Nurse Practitioner

## 2018-11-08 VITALS — BP 130/76 | HR 66 | Resp 16 | Ht 70.0 in | Wt 180.0 lb

## 2018-11-08 DIAGNOSIS — N281 Cyst of kidney, acquired: Secondary | ICD-10-CM

## 2018-11-08 NOTE — Progress Notes (Signed)
[facility] [address] [place], [place] [address]  Internal MEDICINE  Office Visit Note  Patient Name: Penny Hensley  [id number]  [id number]  Date of Service: 11/17/2018  Chief Complaint  Patient presents with  . Labs Only    follow up ultrasound results    She is having worsening right flank pain, especially with exertion or standing and sitting still for long periods of time. She does have a small renal cyst on right kidney, and this is exact spot where she is having pain. This started getting worse in October and November and has continued to worsen. States that by the end of the day, it hurts her to move or to sit. After she goes to bed and wakes up, the pain is completely gone. She has had renal ultrasound since her most recent visit. Right renal cyst is larger, now at 4.3X4.2X4.2cm in diameter. There is now a second cyst measuring 1.2X1.1X1.2cm in diameter.      Current Medication: Outpatient Encounter Medications as of 11/08/2018  Medication Sig  . B Complex Vitamins (B COMPLEX PO) Take by mouth.  . Calcium Citrate 200 MG TABS Take by mouth.  . cyclobenzaprine (FLEXERIL) 10 MG tablet Take by mouth.  . estrogen, conjugated,-medroxyprogesterone (PREMPRO) 0.3-1.5 MG tablet   . fluocinonide cream (LIDEX) 0.05 % Apply 1 application topically 2 (two) times daily.  . furosemide (LASIX) 20 MG tablet Take 1 tablet (20 mg total) by mouth daily as needed.  . Multiple Vitamin (MULTI-VITAMINS) TABS Take by mouth.  Marland Kitchen omeprazole (PRILOSEC) 20 MG capsule TAKE ONE CAPSULE BY MOUTH EVERY DAY  . pravastatin (PRAVACHOL) 20 MG tablet TAKE 1 TABLET BY MOUTH EVERY DAY  . progesterone (PROMETRIUM) 100 MG capsule Take 100 mg by mouth daily.  . [DISCONTINUED] ibuprofen (ADVIL,MOTRIN) 200 MG tablet Take 4 tablets (800 mg total) by mouth every 8 (eight) hours as needed.   No facility-administered encounter medications on file as of 11/08/2018.     Surgical History: Past Surgical  History:  Procedure Laterality Date  . CHOLECYSTECTOMY      Medical History: Past Medical History:  Diagnosis Date  . Cholecystitis   . Cholelithiasis   . Hyperlipidemia     Family History: Family History  Problem Relation Age of Onset  . Thyroid disease Mother   . Hyperlipidemia Father   . Heart disease Father   . Thyroid disease Maternal Grandmother     Social History   Socioeconomic History  . Marital status: Married    Spouse name: Not on file  . Number of children: Not on file  . Years of education: Not on file  . Highest education level: Not on file  Occupational History  . Not on file  Social Needs  . Financial resource strain: Not on file  . Food insecurity:    Worry: Not on file    Inability: Not on file  . Transportation needs:    Medical: Not on file    Non-medical: Not on file  Tobacco Use  . Smoking status: Never Smoker  . Smokeless tobacco: Never Used  Substance and Sexual Activity  . Alcohol use: No    Frequency: Never  . Drug use: No  . Sexual activity: Not on file  Lifestyle  . Physical activity:    Days per week: Not on file    Minutes per session: Not on file  . Stress: Not on file  Relationships  . Social connections:    Talks on  phone: Not on file    Gets together: Not on file    Attends religious service: Not on file    Active member of club or organization: Not on file    Attends meetings of clubs or organizations: Not on file    Relationship status: Not on file  . Intimate partner violence:    Fear of current or ex partner: Not on file    Emotionally abused: Not on file    Physically abused: Not on file    Forced sexual activity: Not on file  Other Topics Concern  . Not on file  Social History Narrative  . Not on file      Review of Systems  Constitutional: Negative for chills, fatigue and unexpected weight change.  HENT: Negative for congestion, postnasal drip, rhinorrhea, sneezing and sore throat.   Respiratory:  Negative for cough, chest tightness, shortness of breath and wheezing.   Cardiovascular: Negative for chest pain and palpitations.  Gastrointestinal: Negative for abdominal pain, constipation, diarrhea, nausea and vomiting.  Endocrine: Negative for cold intolerance, heat intolerance, polydipsia and polyuria.  Genitourinary: Positive for flank pain. Negative for dysuria, frequency and urgency.       History of right renal cyst.   Musculoskeletal: Positive for back pain. Negative for arthralgias, joint swelling and neck pain.  Skin: Negative for rash.  Allergic/Immunologic: Negative for environmental allergies.  Neurological: Negative for dizziness, tremors, numbness and headaches.  Hematological: Negative for adenopathy. Does not bruise/bleed easily.  Psychiatric/Behavioral: Negative for behavioral problems (Depression), dysphoric mood, sleep disturbance and suicidal ideas. The patient is not nervous/anxious.     Today's Vitals   11/08/18 1441  BP: 130/76  Pulse: 66  Resp: 16  SpO2: 99%  Weight: 180 lb (81.6 kg)  Height: 5\' 10"  (1.778 m)    Physical Exam Vitals signs and nursing note reviewed.  Constitutional:      General: She is not in acute distress.    Appearance: Normal appearance. She is well-developed. She is not diaphoretic.  HENT:     Head: Normocephalic and atraumatic.     Nose: Nose normal.     Mouth/Throat:     Pharynx: No oropharyngeal exudate.  Eyes:     Extraocular Movements: Extraocular movements intact.     Conjunctiva/sclera: Conjunctivae normal.     Pupils: Pupils are equal, round, and reactive to light.  Neck:     Musculoskeletal: Normal range of motion and neck supple.     Thyroid: No thyromegaly.     Vascular: No JVD.     Trachea: No tracheal deviation.  Cardiovascular:     Rate and Rhythm: Normal rate and regular rhythm.     Pulses: Normal pulses.     Heart sounds: Normal heart sounds. No murmur. No friction rub. No gallop.   Pulmonary:      Effort: Pulmonary effort is normal. No respiratory distress.     Breath sounds: Normal breath sounds. No wheezing or rales.  Chest:     Chest wall: No tenderness.     Breasts:        Right: Normal. No swelling, bleeding, inverted nipple, mass, nipple discharge, skin change or tenderness.        Left: Normal. No swelling, bleeding, inverted nipple, mass, nipple discharge, skin change or tenderness.  Abdominal:     General: Abdomen is flat. Bowel sounds are normal.     Palpations: Abdomen is soft.  Genitourinary:    Comments: No CVA tenderness with palpation. Musculoskeletal:  Normal range of motion.  Lymphadenopathy:     Cervical: No cervical adenopathy.  Skin:    General: Skin is warm and dry.     Capillary Refill: Capillary refill takes less than 2 seconds.  Neurological:     Mental Status: She is alert and oriented to person, place, and time.     Cranial Nerves: No cranial nerve deficit.  Psychiatric:        Mood and Affect: Mood normal.        Behavior: Behavior normal.        Thought Content: Thought content normal.        Judgment: Judgment normal.    Assessment/Plan:  1. Acquired renal cyst of right kidney  Right renal cyst is larger, now at 4.3X4.2X4.2cm in diameter. There is now a second cyst measuring 1.2X1.1X1.2cm in diameter. Refer to urology for further evaluation and treatment.  - Ambulatory referral to Urology  General Counseling: Toniqua verbalizes understanding of the findings of todays visit and agrees with plan of treatment. I have discussed any further diagnostic evaluation that may be needed or ordered today. We also reviewed her medications today. she has been encouraged to call the office with any questions or concerns that should arise related to todays visit.  This patient was seen by Vincent GrosHeather Devani Odonnel FNP Collaboration with Dr Lyndon CodeFozia M Khan as a part of collaborative care agreement  Orders Placed This Encounter  Procedures  . Ambulatory referral to Urology       Time spent: 25 Minutes      Dr Lyndon CodeFozia M Khan Internal medicine

## 2018-11-13 ENCOUNTER — Other Ambulatory Visit: Payer: Self-pay | Admitting: Nurse Practitioner

## 2018-11-13 DIAGNOSIS — M5000 Cervical disc disorder with myelopathy, unspecified cervical region: Secondary | ICD-10-CM

## 2018-11-13 MED ORDER — IBUPROFEN 800 MG PO TABS: 800.0000 mg | ORAL_TABLET | Freq: Three times a day (TID) | ORAL | 2 refills | Status: DC | PRN

## 2018-11-13 NOTE — Progress Notes (Signed)
Changed ibuprofen to 800mg  tablet three times daily if needed for pain and sent new prescription

## 2018-11-30 NOTE — Progress Notes (Signed)
12/04/2018  11:17 AM   Penny Hensley 05-Sep-1961 [id number]  Referring provider: [name], NP [address] [place], [place] [address]  Chief Complaint  Patient presents with  . Renal Cyst    New Patient    HPI: Penny Hensley is a 58 y.o. female referred by Vincent Gros, NP for the further evaluation of right renal cysts.  Renal US on 11/01/2018 revealed simple anechoic right renal cysts measuring 4.3 x 4.4 x 4.2 cm and 1.2 x 1.1 x 1.2 cm; the impression describes them as stable right renal cysts.  Report only available today.  Previous CT Abdomen with and without contrast on 01/2012 at which time she had the same renal cyst, measuring 3 x 3 cm, Bosniak I.  Patient reports right sided back pain under her ribs that started as positional, and is worse when sitting.   It never hurts at the beginning of the day, only when she is active during the day time and starts to hurt at the end of the day into the evening.  She described it as now a constant dull pain, most days.  This has been going on for 2 years.   Patient denies gross hematuria and dysuria.  Patient has tried hydocodone and ibuprofen without improvement.  Pain improves with sleep and wearing a compression belt.  Patient notes adverse reaction to naproxen with mouth ulcers.  PMH: Past Medical History:  Diagnosis Date  . Cholecystitis   . Cholelithiasis   . Hyperlipidemia     Surgical History: Past Surgical History:  Procedure Laterality Date  . CHOLECYSTECTOMY      Home Medications:  Allergies as of 12/04/2018      Reactions   Naproxen Other (See Comments)   Mouth Ulcers Mouth Ulcers Mouth Ulcers      Medication List       Accurate as of December 04, 2018 11:17 AM. Always use your most recent med list.        B COMPLEX PO Take by mouth.   Calcium Citrate 200 MG Tabs Take by mouth.   fluocinonide cream 0.05 % Commonly known as:  LIDEX Apply 1 application topically 2 (two) times  daily.   furosemide 20 MG tablet Commonly known as:  LASIX Take 1 tablet (20 mg total) by mouth daily as needed.   ibuprofen 800 MG tablet Commonly known as:  ADVIL,MOTRIN Take 1 tablet (800 mg total) by mouth every 8 (eight) hours as needed.   MULTI-VITAMINS Tabs Take by mouth.   omeprazole 20 MG capsule Commonly known as:  PRILOSEC TAKE ONE CAPSULE BY MOUTH EVERY DAY   pravastatin 20 MG tablet Commonly known as:  PRAVACHOL TAKE 1 TABLET BY MOUTH EVERY DAY   progesterone 100 MG capsule Commonly known as:  PROMETRIUM Take 100 mg by mouth daily.       Allergies:  Allergies  Allergen Reactions  . Naproxen Other (See Comments)    Mouth Ulcers Mouth Ulcers  Mouth Ulcers    Family History: Family History  Problem Relation Age of Onset  . Thyroid disease Mother   . Hyperlipidemia Father   . Heart disease Father   . Thyroid disease Maternal Grandmother     Social History:  reports that she has never smoked. She has never used smokeless tobacco. She reports that she does not drink alcohol or use drugs.  ROS: UROLOGY Frequent Urination?: No Hard to postpone urination?: No Burning/pain with urination?: No Get up at night to  urinate?: No Leakage of urine?: No Urine stream starts and stops?: No Trouble starting stream?: No Do you have to strain to urinate?: No Blood in urine?: No Urinary tract infection?: No Sexually transmitted disease?: No Injury to kidneys or bladder?: Yes Painful intercourse?: No Weak stream?: No Currently pregnant?: No Vaginal bleeding?: No Last menstrual period?: n  Gastrointestinal Nausea?: No Vomiting?: No Indigestion/heartburn?: No Diarrhea?: No Constipation?: No  Constitutional Fever: No Night sweats?: No Weight loss?: No Fatigue?: No  Skin Skin rash/lesions?: No Itching?: No  Eyes Blurred vision?: No Double vision?: No  Ears/Nose/Throat Sore throat?: No Sinus problems?: No  Hematologic/Lymphatic Swollen  glands?: No Easy bruising?: No  Cardiovascular Leg swelling?: No Chest pain?: No  Respiratory Cough?: No Shortness of breath?: No  Endocrine Excessive thirst?: No  Musculoskeletal Back pain?: No Joint pain?: No  Neurological Headaches?: No Dizziness?: No  Psychologic Depression?: No Anxiety?: No  Physical Exam: BP (!) 147/73   Pulse 70   Ht 5\' 10"  (1.778 m)   Wt 181 lb (82.1 kg)   BMI 25.97 kg/m   Constitutional: Well nourished. Alert and oriented, No acute distress. Cardiovascular: No clubbing, cyanosis, or edema. Respiratory: Normal respiratory effort, no increased work of breathing. GU: no CVA tenderness or point tenderness.  Points to just below her right rib cage as localization of pain. Skin: No rashes, bruises or suspicious lesions. Neurologic: Grossly intact, no focal deficits, moving all 4 extremities. Psychiatric: Normal mood and affect.   Laboratory Data:  Urinalysis    Component Value Date/Time   APPEARANCEUR Clear 10/31/2018 1034   GLUCOSEU Negative 10/31/2018 1034   BILIRUBINUR Negative 10/31/2018 1034   PROTEINUR Negative 10/31/2018 1034   NITRITE Negative 10/31/2018 1034   LEUKOCYTESUR Trace (A) 10/31/2018 1034    Lab Results  Component Value Date   LABMICR See below: 10/31/2018   WBCUA 0-5 10/31/2018   RBCUA 0-2 10/31/2018   LABEPIT 0-10 10/31/2018   MUCUS Present 10/31/2018   BACTERIA Few 10/31/2018    Pertinent Imaging:   Unable to review actual images, report only available.    CT abd with and without contrast from 2/013 personally reviewed, 3 cm RUP pole posterior simple renal cyst appreciated.    Assessment & Plan:    1. Right renal cyst, acquired - Previously most consistent with simple renal cyst - Description on most recent RUS is also content with simple renal cyst, however, unable to review actual imaging - Very slow incidental growth in 8 years only 1 cm, which is very reassuring - No further surveillance is  necessary - [facility] and plans to provide disc of images to office for further evaluation  2. Flank pain - Likely unrelated to incidental relevantly small renal cyst - Recommend supportive care - Pain is most consistent with musculoskeletal pain given the nature, chronicity and exacerbating symptoms - Discussed that as these symptoms unlikely related to incidental renal cysts, reccomend follow up with her PCP for further management.  Return for urological symptoms as needed.  [facility] [address], [address] [place], [place] [address] [phone]  I, Duanne MoronKatharine Samson, am acting as a scribe for Vanna ScotlandAshley Kinzie Wickes, MD.    I have reviewed the above documentation for accuracy and completeness, and I agree with the above.   Vanna ScotlandAshley Vasco Chong, MD

## 2018-12-04 ENCOUNTER — Ambulatory Visit: Payer: BLUE CROSS/BLUE SHIELD | Admitting: Urology

## 2018-12-04 ENCOUNTER — Encounter: Payer: Self-pay | Admitting: Urology

## 2018-12-04 VITALS — BP 147/73 | HR 70 | Ht 70.0 in | Wt 181.0 lb

## 2018-12-04 DIAGNOSIS — N281 Cyst of kidney, acquired: Secondary | ICD-10-CM | POA: Diagnosis not present

## 2018-12-04 DIAGNOSIS — R109 Unspecified abdominal pain: Secondary | ICD-10-CM | POA: Diagnosis not present

## 2018-12-06 ENCOUNTER — Ambulatory Visit: Payer: BC Managed Care – PPO | Admitting: Nurse Practitioner

## 2018-12-06 ENCOUNTER — Encounter: Payer: Self-pay | Admitting: Nurse Practitioner

## 2018-12-06 VITALS — BP 118/74 | HR 68 | Resp 16 | Ht 70.0 in | Wt 180.0 lb

## 2018-12-06 DIAGNOSIS — M4316 Spondylolisthesis, lumbar region: Secondary | ICD-10-CM

## 2018-12-06 DIAGNOSIS — N281 Cyst of kidney, acquired: Secondary | ICD-10-CM

## 2018-12-06 NOTE — Progress Notes (Signed)
[facility] [address] [place], [place] [address]  Internal MEDICINE  Office Visit Note  Patient Name: Penny Hensley  [id number]  [id number]  Date of Service: 12/11/2018  Chief Complaint  Patient presents with  . Medical Management of Chronic Issues    pt here to discuss visit with urologist,     She is having worsening right flank pain, especially with exertion or standing and sitting still for long periods of time. She does have a small renal cyst on right kidney, and this is exact spot where she is having pain. This started getting worse in October and November and has continued to worsen. States that by the end of the day, it hurts her to move or to sit. After she goes to bed and wakes up, the pain is completely gone. She has had renal ultrasound since her most recent visit. Right renal cyst is larger, now at 4.3X4.2X4.2cm in diameter. There is now a second cyst measuring 1.2X1.1X1.2cm in diameter. She was referred to urology after her last visit. Urology unimpressed and does not feel that pain she is experiencing Is in any way related to the cysts in kidney. She was referred back to me for further evaluation.       Current Medication: Outpatient Encounter Medications as of 12/06/2018  Medication Sig  . B Complex Vitamins (B COMPLEX PO) Take by mouth.  . Calcium Citrate 200 MG TABS Take by mouth.  . fluocinonide cream (LIDEX) 0.05 % Apply 1 application topically 2 (two) times daily.  . furosemide (LASIX) 20 MG tablet Take 1 tablet (20 mg total) by mouth daily as needed.  Marland Kitchen ibuprofen (ADVIL,MOTRIN) 800 MG tablet Take 1 tablet (800 mg total) by mouth every 8 (eight) hours as needed.  . Multiple Vitamin (MULTI-VITAMINS) TABS Take by mouth.  Marland Kitchen omeprazole (PRILOSEC) 20 MG capsule TAKE ONE CAPSULE BY MOUTH EVERY DAY  . pravastatin (PRAVACHOL) 20 MG tablet TAKE 1 TABLET BY MOUTH EVERY DAY  . progesterone (PROMETRIUM) 100 MG capsule Take 100 mg by mouth daily.   No  facility-administered encounter medications on file as of 12/06/2018.     Surgical History: Past Surgical History:  Procedure Laterality Date  . CHOLECYSTECTOMY      Medical History: Past Medical History:  Diagnosis Date  . Cholecystitis   . Cholelithiasis   . Hyperlipidemia     Family History: Family History  Problem Relation Age of Onset  . Thyroid disease Mother   . Hyperlipidemia Father   . Heart disease Father   . Thyroid disease Maternal Grandmother     Social History   Socioeconomic History  . Marital status: Married    Spouse name: Not on file  . Number of children: Not on file  . Years of education: Not on file  . Highest education level: Not on file  Occupational History  . Not on file  Social Needs  . Financial resource strain: Not on file  . Food insecurity:    Worry: Not on file    Inability: Not on file  . Transportation needs:    Medical: Not on file    Non-medical: Not on file  Tobacco Use  . Smoking status: Never Smoker  . Smokeless tobacco: Never Used  Substance and Sexual Activity  . Alcohol use: No    Frequency: Never  . Drug use: No  . Sexual activity: Not on file  Lifestyle  . Physical activity:    Days per week: Not on  file    Minutes per session: Not on file  . Stress: Not on file  Relationships  . Social connections:    Talks on phone: Not on file    Gets together: Not on file    Attends religious service: Not on file    Active member of club or organization: Not on file    Attends meetings of clubs or organizations: Not on file    Relationship status: Not on file  . Intimate partner violence:    Fear of current or ex partner: Not on file    Emotionally abused: Not on file    Physically abused: Not on file    Forced sexual activity: Not on file  Other Topics Concern  . Not on file  Social History Narrative  . Not on file      Review of Systems  Constitutional: Negative for chills, fatigue and unexpected weight  change.  HENT: Negative for congestion, postnasal drip, rhinorrhea, sneezing and sore throat.   Respiratory: Negative for cough, chest tightness, shortness of breath and wheezing.   Cardiovascular: Negative for chest pain and palpitations.  Gastrointestinal: Negative for abdominal pain, constipation, diarrhea, nausea and vomiting.  Endocrine: Negative for cold intolerance, heat intolerance, polydipsia and polyuria.  Genitourinary: Positive for flank pain. Negative for dysuria, frequency and urgency.       History of right renal cyst.   Musculoskeletal: Positive for back pain and neck pain. Negative for arthralgias and joint swelling.  Skin: Negative for rash.  Allergic/Immunologic: Negative for environmental allergies.  Neurological: Negative for dizziness, tremors, numbness and headaches.  Hematological: Negative for adenopathy. Does not bruise/bleed easily.  Psychiatric/Behavioral: Negative for behavioral problems (Depression), dysphoric mood, sleep disturbance and suicidal ideas. The patient is not nervous/anxious.     Today's Vitals   12/06/18 1145  BP: 118/74  Pulse: 68  Resp: 16  SpO2: 97%  Weight: 180 lb (81.6 kg)  Height: 5\' 10"  (1.778 m)   Body mass index is 25.83 kg/m.  Physical Exam Vitals signs and nursing note reviewed.  Constitutional:      General: She is not in acute distress.    Appearance: Normal appearance. She is well-developed. She is not diaphoretic.  HENT:     Head: Normocephalic and atraumatic.     Nose: Nose normal.     Mouth/Throat:     Pharynx: No oropharyngeal exudate.  Eyes:     Extraocular Movements: Extraocular movements intact.     Conjunctiva/sclera: Conjunctivae normal.     Pupils: Pupils are equal, round, and reactive to light.  Neck:     Musculoskeletal: Normal range of motion and neck supple.     Thyroid: No thyromegaly.     Vascular: No JVD.     Trachea: No tracheal deviation.  Cardiovascular:     Rate and Rhythm: Normal rate and  regular rhythm.     Pulses: Normal pulses.     Heart sounds: Normal heart sounds. No murmur. No friction rub. No gallop.   Pulmonary:     Effort: Pulmonary effort is normal. No respiratory distress.     Breath sounds: Normal breath sounds. No wheezing or rales.  Chest:     Chest wall: No tenderness.     Breasts:        Right: Normal. No swelling, bleeding, inverted nipple, mass, nipple discharge, skin change or tenderness.        Left: Normal. No swelling, bleeding, inverted nipple, mass, nipple discharge, skin change or tenderness.  Abdominal:     General: Abdomen is flat. Bowel sounds are normal.     Palpations: Abdomen is soft.  Genitourinary:    Comments: No CVA tenderness with palpation. Musculoskeletal: Normal range of motion.  Lymphadenopathy:     Cervical: No cervical adenopathy.  Skin:    General: Skin is warm and dry.     Capillary Refill: Capillary refill takes less than 2 seconds.  Neurological:     Mental Status: She is alert and oriented to person, place, and time.     Cranial Nerves: No cranial nerve deficit.  Psychiatric:        Mood and Affect: Mood normal.        Behavior: Behavior normal.        Thought Content: Thought content normal.        Judgment: Judgment normal.   Assessment/Plan: 1. Spondylolisthesis of lumbar region History of lumbar spine spondylolisthesis on x-ray in the past. Will get MRI for further evaluation. Refer to orthopedics for further evaluation as indicated.  - MR Lumbar Spine Wo Contrast; Future  2. Acquired renal cyst of right kidney Will continue to monitor.   General Counseling: Whitlee verbalizes understanding of the findings of todays visit and agrees with plan of treatment. I have discussed any further diagnostic evaluation that may be needed or ordered today. We also reviewed her medications today. she has been encouraged to call the office with any questions or concerns that should arise related to todays visit.  This patient  was seen by Vincent GrosHeather Tharun Cappella FNP Collaboration with Dr Lyndon CodeFozia M Khan as a part of collaborative care agreement  Orders Placed This Encounter  Procedures  . MR Lumbar Spine Wo Contrast     Time spent: 25 Minutes      Dr Lyndon CodeFozia M Khan Internal medicine

## 2018-12-11 DIAGNOSIS — M4316 Spondylolisthesis, lumbar region: Secondary | ICD-10-CM | POA: Insufficient documentation

## 2019-01-02 ENCOUNTER — Other Ambulatory Visit: Payer: Self-pay

## 2019-01-02 ENCOUNTER — Ambulatory Visit
Admission: RE | Admit: 2019-01-02 | Discharge: 2019-01-02 | Disposition: A | Discharge status: Home / Self Care | Payer: BLUE CROSS/BLUE SHIELD | Source: Ambulatory Visit | Attending: Nurse Practitioner | Admitting: Nurse Practitioner

## 2019-01-02 DIAGNOSIS — M4316 Spondylolisthesis, lumbar region: Secondary | ICD-10-CM | POA: Diagnosis not present

## 2019-02-03 ENCOUNTER — Other Ambulatory Visit: Payer: Self-pay

## 2019-02-03 MED ORDER — PRAVASTATIN SODIUM 20 MG PO TABS: 20.0000 mg | ORAL_TABLET | Freq: Every day | ORAL | 3 refills | Status: DC

## 2019-04-14 ENCOUNTER — Other Ambulatory Visit: Payer: Self-pay

## 2019-04-14 ENCOUNTER — Encounter: Payer: Self-pay | Admitting: Nurse Practitioner

## 2019-04-14 ENCOUNTER — Ambulatory Visit (INDEPENDENT_AMBULATORY_CARE_PROVIDER_SITE_OTHER): Payer: BC Managed Care – PPO | Admitting: Nurse Practitioner

## 2019-04-14 VITALS — BP 131/69 | HR 58 | Resp 16 | Ht 70.0 in | Wt 180.6 lb

## 2019-04-14 DIAGNOSIS — M4316 Spondylolisthesis, lumbar region: Secondary | ICD-10-CM

## 2019-04-14 DIAGNOSIS — N8110 Cystocele, unspecified: Secondary | ICD-10-CM | POA: Diagnosis not present

## 2019-04-14 NOTE — Progress Notes (Signed)
Pt pulse is low, informed provider

## 2019-04-14 NOTE — Progress Notes (Signed)
[facility] [address] [place], [place] [address]  Internal MEDICINE  Office Visit Note  Patient Name: Penny HeftyDebora A Hensley  [id number]  [id number]  Date of Service: 04/14/2019  Chief Complaint  Patient presents with  . Medical Management of Chronic Issues  . Labs Only    discuss MRI  . Bladder Prolapse     She is having worsening right mid back pain,  especially with exertion. Hurts even more when she sits for long periods of time  This started getting worse in October and November and has continued to worsen. States that by the end of the day, it hurts her to move or to sit. After she goes to bed and wakes up, the pain is completely gone. She has had renal ultrasound since her most recent visit. Right renal cyst is larger, now at 4.3X4.2X4.2cm in diameter. There is now a second cyst measuring 1.2X1.1X1.2cm in diameter. She was referred to urology after her last visit. Urology unimpressed and does not feel that pain she is experiencing Is in any way related to the cysts in kidney. Since the last isit, she had MRI of the lumbar spine. Results indicate L1-2 and L2-3: Mild disc desiccation and disc bulges. No canal or foraminal stenosis. Mild facet degeneration but without edematous change. Findings are not likely symptomatic. L3-4: Normal appearance of the disc and facets. L4-5: Mild bulging of the disc. Mild facet hypertrophy. No compressive stenosis. No facet edema. L5-S1: Minimal disc bulge and mild facet degeneration. No stenosis. No facet edema. She also believes she as a prolapsed bladder. She can feel bulge in vaginal area when she uses the bathroom. She has noted no dysuria, hematuria, or urinary frequency. This is not painful.   Pt is here for a sick visit.     Current Medication:  Outpatient Encounter Medications as of 04/14/2019  Medication Sig  . B Complex Vitamins (B COMPLEX PO) Take by mouth.  . Calcium Citrate 200 MG TABS Take by mouth.  . fluocinonide  cream (LIDEX) 0.05 % Apply 1 application topically 2 (two) times daily.  . furosemide (LASIX) 20 MG tablet Take 1 tablet (20 mg total) by mouth daily as needed.  Marland Kitchen. ibuprofen (ADVIL,MOTRIN) 800 MG tablet Take 1 tablet (800 mg total) by mouth every 8 (eight) hours as needed.  . Multiple Vitamin (MULTI-VITAMINS) TABS Take by mouth.  Marland Kitchen. omeprazole (PRILOSEC) 20 MG capsule TAKE ONE CAPSULE BY MOUTH EVERY DAY  . pravastatin (PRAVACHOL) 20 MG tablet Take 1 tablet (20 mg total) by mouth daily.  . progesterone (PROMETRIUM) 100 MG capsule Take 100 mg by mouth daily.   No facility-administered encounter medications on file as of 04/14/2019.       Medical History: Past Medical History:  Diagnosis Date  . Cholecystitis   . Cholelithiasis   . Hyperlipidemia      Vital Signs: BP 131/69 (BP Location: Right Arm, Patient Position: Sitting, Cuff Size: Large)   Pulse (!) 58   Resp 16   Ht 5\' 10"  (1.778 m)   Wt 180 lb 9.6 oz (81.9 kg)   SpO2 100%   BMI 25.91 kg/m    Review of Systems  Constitutional: Negative for chills, fatigue and unexpected weight change.  HENT: Negative for congestion, postnasal drip, rhinorrhea, sneezing and sore throat.   Respiratory: Negative for cough, chest tightness, shortness of breath and wheezing.   Cardiovascular: Negative for chest pain and palpitations.  Gastrointestinal: Negative for abdominal pain, constipation, diarrhea, nausea and vomiting.  Endocrine: Negative for cold intolerance, heat intolerance, polydipsia and polyuria.  Genitourinary: Positive for flank pain. Negative for dysuria, frequency and urgency.       Feels as though she has developed a prolapse of the bladder .  Musculoskeletal: Positive for back pain and neck pain. Negative for arthralgias and joint swelling.  Skin: Negative for rash.  Allergic/Immunologic: Negative for environmental allergies.  Neurological: Negative for dizziness, tremors, numbness and headaches.  Hematological: Negative  for adenopathy. Does not bruise/bleed easily.  Psychiatric/Behavioral: Negative for behavioral problems (Depression), dysphoric mood, sleep disturbance and suicidal ideas. The patient is not nervous/anxious.     Physical Exam Vitals signs and nursing note reviewed.  Constitutional:      General: She is not in acute distress.    Appearance: Normal appearance. She is well-developed. She is not diaphoretic.  HENT:     Head: Normocephalic and atraumatic.     Nose: Nose normal.     Mouth/Throat:     Pharynx: No oropharyngeal exudate.  Eyes:     Extraocular Movements: Extraocular movements intact.     Conjunctiva/sclera: Conjunctivae normal.     Pupils: Pupils are equal, round, and reactive to light.  Neck:     Musculoskeletal: Normal range of motion and neck supple.     Thyroid: No thyromegaly.     Vascular: No JVD.     Trachea: No tracheal deviation.  Cardiovascular:     Rate and Rhythm: Normal rate and regular rhythm.     Pulses: Normal pulses.     Heart sounds: Normal heart sounds. No murmur. No friction rub. No gallop.   Pulmonary:     Effort: Pulmonary effort is normal. No respiratory distress.     Breath sounds: Normal breath sounds. No wheezing or rales.  Chest:     Chest wall: No tenderness.     Breasts:        Right: Normal. No swelling, bleeding, inverted nipple, mass, nipple discharge, skin change or tenderness.        Left: Normal. No swelling, bleeding, inverted nipple, mass, nipple discharge, skin change or tenderness.  Abdominal:     General: Abdomen is flat. Bowel sounds are normal.     Palpations: Abdomen is soft.  Genitourinary:    Comments: No CVA tenderness with palpation. Musculoskeletal: Normal range of motion.  Lymphadenopathy:     Cervical: No cervical adenopathy.  Skin:    General: Skin is warm and dry.     Capillary Refill: Capillary refill takes less than 2 seconds.  Neurological:     Mental Status: She is alert and oriented to person, place, and  time.     Cranial Nerves: No cranial nerve deficit.  Psychiatric:        Mood and Affect: Mood normal.        Behavior: Behavior normal.        Thought Content: Thought content normal.        Judgment: Judgment normal.   Assessment/Plan: 1. Spondylolisthesis of lumbar region Reviewed results of lumbar spine MRI with the patient. Refer to orthopedics for further evaluation.  - Ambulatory referral to Orthopedic Surgery  2. Bladder prolapse, female, acquired Will get ultrasound of the bladder for further evaluation. Consider referral to GYN or urogynecology as indicated  - [place], retroperitnl abd,  ltd; Future  General Counseling: Nayra verbalizes understanding of the findings of todays visit and agrees with plan of treatment. I have discussed any further diagnostic evaluation that may be needed or  ordered today. We also reviewed her medications today. she has been encouraged to call the office with any questions or concerns that should arise related to todays visit.    Counseling:  This patient was seen by Leretha Pol FNP Collaboration with Dr Lavera Guise as a part of collaborative care agreement  Orders Placed This Encounter  Procedures  . Ambulatory referral to Orthopedic Surgery  . [place], retroperitnl abd,  ltd      Time spent: 25 Minutes

## 2019-04-22 ENCOUNTER — Other Ambulatory Visit: Payer: Self-pay | Admitting: Nurse Practitioner

## 2019-04-22 DIAGNOSIS — N8110 Cystocele, unspecified: Secondary | ICD-10-CM

## 2019-05-02 ENCOUNTER — Other Ambulatory Visit: Payer: BC Managed Care – PPO

## 2019-05-09 ENCOUNTER — Other Ambulatory Visit: Payer: BC Managed Care – PPO

## 2019-05-23 ENCOUNTER — Other Ambulatory Visit: Payer: Self-pay

## 2019-05-23 ENCOUNTER — Ambulatory Visit: Payer: BC Managed Care – PPO

## 2019-05-23 DIAGNOSIS — N8110 Cystocele, unspecified: Secondary | ICD-10-CM

## 2019-06-03 ENCOUNTER — Encounter: Payer: Self-pay | Admitting: Nurse Practitioner

## 2019-06-04 NOTE — Telephone Encounter (Signed)
Hey. Did we talk about referring her to uro/gyn?

## 2019-07-01 ENCOUNTER — Other Ambulatory Visit: Payer: Self-pay

## 2019-07-01 MED ORDER — PRAVASTATIN SODIUM 20 MG PO TABS: 20.0000 mg | ORAL_TABLET | Freq: Every day | ORAL | 3 refills | Status: DC

## 2019-09-11 ENCOUNTER — Other Ambulatory Visit: Payer: Self-pay

## 2019-09-11 MED ORDER — OMEPRAZOLE 20 MG PO CPDR: 20.0000 mg | DELAYED_RELEASE_CAPSULE | Freq: Every day | ORAL | 1 refills | Status: DC

## 2019-10-30 ENCOUNTER — Telehealth: Payer: Self-pay

## 2019-10-30 NOTE — Telephone Encounter (Signed)
LMOM TO CONFIRM AND SCREEN FOR 11-03-19 OV. 

## 2019-10-30 NOTE — Telephone Encounter (Signed)
Confirmed pt appt for 11/03/2019. Penny Hensley

## 2019-11-03 ENCOUNTER — Other Ambulatory Visit: Payer: Self-pay

## 2019-11-03 ENCOUNTER — Ambulatory Visit (INDEPENDENT_AMBULATORY_CARE_PROVIDER_SITE_OTHER): Payer: Managed Care, Other (non HMO) | Admitting: Nurse Practitioner

## 2019-11-03 ENCOUNTER — Encounter: Payer: Self-pay | Admitting: Nurse Practitioner

## 2019-11-03 VITALS — BP 120/74 | HR 62 | Resp 16 | Ht 70.0 in | Wt 185.0 lb

## 2019-11-03 DIAGNOSIS — R300 Dysuria: Secondary | ICD-10-CM

## 2019-11-03 DIAGNOSIS — L209 Atopic dermatitis, unspecified: Secondary | ICD-10-CM | POA: Diagnosis not present

## 2019-11-03 DIAGNOSIS — Z1231 Encounter for screening mammogram for malignant neoplasm of breast: Secondary | ICD-10-CM

## 2019-11-03 DIAGNOSIS — Z0001 Encounter for general adult medical examination with abnormal findings: Secondary | ICD-10-CM | POA: Diagnosis not present

## 2019-11-03 DIAGNOSIS — M5000 Cervical disc disorder with myelopathy, unspecified cervical region: Secondary | ICD-10-CM

## 2019-11-03 DIAGNOSIS — Z1159 Encounter for screening for other viral diseases: Secondary | ICD-10-CM

## 2019-11-03 MED ORDER — PRAVASTATIN SODIUM 20 MG PO TABS: 20.0000 mg | ORAL_TABLET | Freq: Every day | ORAL | 3 refills | Status: DC

## 2019-11-03 MED ORDER — FLUOCINONIDE 0.05 % EX SOLN: 1.0000 "application " | Freq: Two times a day (BID) | CUTANEOUS | 2 refills | Status: DC

## 2019-11-03 MED ORDER — GABAPENTIN 100 MG PO CAPS: 100.0000 mg | ORAL_CAPSULE | Freq: Every day | ORAL | 3 refills | Status: DC

## 2019-11-03 NOTE — Progress Notes (Signed)
[facility] [place], [place] [address]  Internal MEDICINE  Office Visit Note  Patient Name: Penny Hensley  [id number]  [id number]  Date of Service: 11/04/2019  Chief Complaint  Patient presents with  . Annual Exam  . Hyperlipidemia  . Gastroesophageal Reflux  . Pruritis    left arm and right shoulder      The patient is here for health maintenance exam. Today, she is complaining of "itching" sensation in both shoulders and arms. She states that this sensation is below the skin. unable to scratch the location. She denies rash or lesion to the arms or shoulder. She will scratch so hard, she leaves red marks and irritation.  She is due to have routine, fasting labs done. She is also due to have a mammogram.   Pt is here for routine health maintenance examination  Current Medication: Outpatient Encounter Medications as of 11/03/2019  Medication Sig  . fluocinonide (LIDEX) 0.05 % external solution Apply 1 application topically 2 (two) times daily.  . furosemide (LASIX) 20 MG tablet Take 1 tablet (20 mg total) by mouth daily as needed.  Marland Kitchen ibuprofen (ADVIL,MOTRIN) 800 MG tablet Take 1 tablet (800 mg total) by mouth every 8 (eight) hours as needed.  . Multiple Vitamin (MULTI-VITAMINS) TABS Take by mouth.  Marland Kitchen omeprazole (PRILOSEC) 20 MG capsule Take 1 capsule (20 mg total) by mouth daily.  . [DISCONTINUED] fluocinonide (LIDEX) 0.05 % external solution Apply 1 application topically 2 (two) times daily.   . [DISCONTINUED] pravastatin (PRAVACHOL) 20 MG tablet Take 1 tablet (20 mg total) by mouth daily.  Marland Kitchen gabapentin (NEURONTIN) 100 MG capsule Take 1 capsule (100 mg total) by mouth at bedtime.  . [DISCONTINUED] B Complex Vitamins (B COMPLEX PO) Take by mouth.  . [DISCONTINUED] Calcium Citrate 200 MG TABS Take by mouth.  . [DISCONTINUED] progesterone (PROMETRIUM) 100 MG capsule Take 100 mg by mouth daily.   No facility-administered encounter medications on  file as of 11/03/2019.    Surgical History: Past Surgical History:  Procedure Laterality Date  . CHOLECYSTECTOMY      Medical History: Past Medical History:  Diagnosis Date  . Cholecystitis   . Cholelithiasis   . Hyperlipidemia     Family History: Family History  Problem Relation Age of Onset  . Thyroid disease Mother   . Hyperlipidemia Father   . Heart disease Father   . Thyroid disease Maternal Grandmother       Review of Systems  Constitutional: Negative for chills, fatigue and unexpected weight change.  HENT: Negative for congestion, postnasal drip, rhinorrhea, sneezing and sore throat.   Respiratory: Negative for cough, chest tightness, shortness of breath and wheezing.   Cardiovascular: Negative for chest pain and palpitations.  Gastrointestinal: Negative for abdominal pain, constipation, diarrhea, nausea and vomiting.  Endocrine: Negative for cold intolerance, heat intolerance, polydipsia and polyuria.  Genitourinary: Negative for dysuria, flank pain, frequency and urgency.  Musculoskeletal: Positive for back pain and neck pain. Negative for arthralgias and joint swelling.  Skin: Negative for rash.  Allergic/Immunologic: Negative for environmental allergies.  Neurological: Negative for dizziness, tremors, numbness and headaches.  Hematological: Negative for adenopathy. Does not bruise/bleed easily.  Psychiatric/Behavioral: Negative for behavioral problems (Depression), dysphoric mood, sleep disturbance and suicidal ideas. The patient is not nervous/anxious.      Today's Vitals   11/03/19 1011  BP: 120/74  Pulse: 62  Resp: 16  SpO2: 99%  Weight: 185 lb (83.9 kg)  Height: 5\' 10"  (1.778  m)   Body mass index is 26.54 kg/m.  Physical Exam Vitals and nursing note reviewed.  Constitutional:      General: She is not in acute distress.    Appearance: Normal appearance. She is well-developed. She is not diaphoretic.  HENT:     Head: Normocephalic and  atraumatic.     Nose: Nose normal.     Mouth/Throat:     Pharynx: No oropharyngeal exudate.  Eyes:     Pupils: Pupils are equal, round, and reactive to light.  Neck:     Thyroid: No thyromegaly.     Vascular: No JVD.     Trachea: No tracheal deviation.  Cardiovascular:     Rate and Rhythm: Normal rate and regular rhythm.     Heart sounds: Normal heart sounds. No murmur. No friction rub. No gallop.   Pulmonary:     Effort: Pulmonary effort is normal. No respiratory distress.     Breath sounds: Normal breath sounds. No wheezing or rales.  Chest:     Chest wall: No tenderness.     Breasts:        Right: Normal. No swelling, bleeding, inverted nipple, mass, nipple discharge, skin change or tenderness.        Left: Normal. No swelling, bleeding, inverted nipple, mass, nipple discharge, skin change or tenderness.  Abdominal:     General: Bowel sounds are normal.     Palpations: Abdomen is soft.     Tenderness: There is no abdominal tenderness.  Musculoskeletal:        General: Normal range of motion.     Cervical back: Normal range of motion and neck supple.  Lymphadenopathy:     Cervical: No cervical adenopathy.     Upper Body:     Right upper body: No axillary adenopathy.     Left upper body: No axillary adenopathy.  Skin:    General: Skin is warm and dry.  Neurological:     Mental Status: She is alert and oriented to person, place, and time.     Cranial Nerves: No cranial nerve deficit.  Psychiatric:        Behavior: Behavior normal.        Thought Content: Thought content normal.        Judgment: Judgment normal.      LABS: Recent Results (from the past 2160 hour(s))  UA/M w/rflx Culture, Routine     Status: Abnormal (Preliminary result)   Collection Time: 11/03/19 10:10 AM   Specimen: Urine   URINE  Result Value Ref Range   Specific Gravity, UA 1.027 1.005 - 1.030   pH, UA 5.5 5.0 - 7.5   Color, UA Yellow Yellow   Appearance Ur Clear Clear   Leukocytes,UA Trace  (A) Negative   Protein,UA Negative Negative/Trace   Glucose, UA Negative Negative   Ketones, UA Trace (A) Negative   RBC, UA Negative Negative   Bilirubin, UA Negative Negative   Urobilinogen, Ur 0.2 0.2 - 1.0 mg/dL   Nitrite, UA Negative Negative   Microscopic Examination See below:     Comment: Microscopic was indicated and was performed.   Urinalysis Reflex Comment     Comment: This specimen has reflexed to a Urine Culture.  Microscopic Examination     Status: None   Collection Time: 11/03/19 10:10 AM   URINE  Result Value Ref Range   WBC, UA 0-5 0 - 5 /hpf   RBC 0-2 0 - 2 /hpf   Epithelial Cells (  non renal) 0-10 0 - 10 /hpf   Casts None seen None seen /lpf   Mucus, UA Present Not Estab.   Bacteria, UA None seen None seen/Few  Urine Culture, Reflex     Status: None (Preliminary result)   Collection Time: 11/03/19 10:10 AM   URINE  Result Value Ref Range   Urine Culture, Routine WILL FOLLOW     Assessment/Plan: 1. Encounter for general adult medical examination with abnormal findings [facility] maintenance exam today   2. Cervical disc disease with myelopathy Start gabapentin at bedtime to help with nerve irritation in the shoulders and arms.  - gabapentin (NEURONTIN) 100 MG capsule; Take 1 capsule (100 mg total) by mouth at bedtime.  Dispense: 30 capsule; Refill: 3  3. Atopic dermatitis, unspecified type Renew lidex which may be used twice daily as needed - fluocinonide (LIDEX) 0.05 % external solution; Apply 1 application topically 2 (two) times daily.  Dispense: 60 mL; Refill: 2  4. Encounter for screening mammogram for malignant neoplasm of breast - screening mammo; Future  5. Encounter for hepatitis C screening test for low risk patient Hepatitis c screening with routine, fasting labs   6. Dysuria - UA/M w/rflx Culture, Routine  General Counseling: Evyn verbalizes understanding of the findings of todays visit and agrees with plan of treatment. I have  discussed any further diagnostic evaluation that may be needed or ordered today. We also reviewed her medications today. she has been encouraged to call the office with any questions or concerns that should arise related to todays visit.    Counseling:  This patient was seen by Vincent Gros FNP Collaboration with Dr Lyndon Code as a part of collaborative care agreement  Orders Placed This Encounter  Procedures  . Microscopic Examination  . Urine Culture, Reflex  . screening mammo  . UA/M w/rflx Culture, Routine    Meds ordered this encounter  Medications  . fluocinonide (LIDEX) 0.05 % external solution    Sig: Apply 1 application topically 2 (two) times daily.    Dispense:  60 mL    Refill:  2    Order Specific Question:   Supervising Provider    Answer:   Lyndon Code [[id number]]  . gabapentin (NEURONTIN) 100 MG capsule    Sig: Take 1 capsule (100 mg total) by mouth at bedtime.    Dispense:  30 capsule    Refill:  3    Order Specific Question:   Supervising Provider    Answer:   Lyndon Code [[id number]]    Total Time spent: 45 Minutes    Time spent with patient included reviewing progress notes, labs, imaging studies, and discussing plan for follow up.   Lyndon Code, MD  Internal Medicine

## 2019-11-04 DIAGNOSIS — L209 Atopic dermatitis, unspecified: Secondary | ICD-10-CM | POA: Insufficient documentation

## 2019-11-04 DIAGNOSIS — Z1159 Encounter for screening for other viral diseases: Secondary | ICD-10-CM | POA: Insufficient documentation

## 2019-11-04 DIAGNOSIS — Z1231 Encounter for screening mammogram for malignant neoplasm of breast: Secondary | ICD-10-CM | POA: Insufficient documentation

## 2019-11-06 ENCOUNTER — Telehealth: Payer: Self-pay

## 2019-11-06 ENCOUNTER — Other Ambulatory Visit: Payer: Self-pay | Admitting: Nurse Practitioner

## 2019-11-06 DIAGNOSIS — N390 Urinary tract infection, site not specified: Secondary | ICD-10-CM

## 2019-11-06 LAB — UA/M W/RFLX CULTURE, ROUTINE
Bilirubin, UA: NEGATIVE
Glucose, UA: NEGATIVE
Nitrite, UA: NEGATIVE
Protein,UA: NEGATIVE
RBC, UA: NEGATIVE
Specific Gravity, UA: 1.027 (ref 1.005–1.030)
Urobilinogen, Ur: 0.2 mg/dL (ref 0.2–1.0)
pH, UA: 5.5 (ref 5.0–7.5)

## 2019-11-06 LAB — MICROSCOPIC EXAMINATION
Bacteria, UA: NONE SEEN
Casts: NONE SEEN /lpf

## 2019-11-06 LAB — URINE CULTURE, REFLEX

## 2019-11-06 MED ORDER — CIPROFLOXACIN HCL 500 MG PO TABS: 500.0000 mg | ORAL_TABLET | Freq: Two times a day (BID) | ORAL | 0 refills | Status: DC

## 2019-11-06 NOTE — Progress Notes (Signed)
Please let the patient know that she had uti present during physical. Sent cipro 500mg  bid for 10 days to her pharmacy. thanks

## 2019-11-06 NOTE — Telephone Encounter (Signed)
-----   Message from [name], NP sent at 11/06/2019  3:41 PM EST ----- Please let the patient know that she had uti present during physical. Sent cipro 500mg  bid for 10 days to her pharmacy. thanks

## 2019-11-06 NOTE — Telephone Encounter (Signed)
Pt was notifed. 

## 2019-11-06 NOTE — Progress Notes (Signed)
uti present during physical. Sent cipro 500mg  bid for 10 days to her pharmacy.

## 2019-11-06 NOTE — Progress Notes (Signed)
Will wait for culture and sensitivity results.

## 2019-12-04 ENCOUNTER — Other Ambulatory Visit: Payer: Self-pay

## 2019-12-04 MED ORDER — OMEPRAZOLE 20 MG PO CPDR: 20.0000 mg | DELAYED_RELEASE_CAPSULE | Freq: Every day | ORAL | 3 refills | Status: DC

## 2019-12-15 ENCOUNTER — Other Ambulatory Visit: Payer: Self-pay

## 2019-12-15 DIAGNOSIS — R600 Localized edema: Secondary | ICD-10-CM

## 2019-12-15 MED ORDER — FUROSEMIDE 20 MG PO TABS: 20.0000 mg | ORAL_TABLET | Freq: Every day | ORAL | 5 refills | Status: DC | PRN

## 2019-12-28 ENCOUNTER — Other Ambulatory Visit: Payer: Self-pay | Admitting: Nurse Practitioner

## 2019-12-28 DIAGNOSIS — M5000 Cervical disc disorder with myelopathy, unspecified cervical region: Secondary | ICD-10-CM

## 2019-12-29 ENCOUNTER — Other Ambulatory Visit: Payer: Self-pay

## 2019-12-29 MED ORDER — PRAVASTATIN SODIUM 20 MG PO TABS: 20.0000 mg | ORAL_TABLET | Freq: Every day | ORAL | 3 refills | Status: DC

## 2019-12-30 ENCOUNTER — Other Ambulatory Visit: Payer: Self-pay | Admitting: Nurse Practitioner

## 2019-12-31 ENCOUNTER — Other Ambulatory Visit: Payer: Self-pay

## 2019-12-31 ENCOUNTER — Ambulatory Visit
Admission: RE | Admit: 2019-12-31 | Discharge: 2019-12-31 | Disposition: A | Discharge status: Home / Self Care | Payer: Managed Care, Other (non HMO) | Source: Ambulatory Visit | Attending: Nurse Practitioner | Admitting: Nurse Practitioner

## 2019-12-31 DIAGNOSIS — Z1231 Encounter for screening mammogram for malignant neoplasm of breast: Secondary | ICD-10-CM | POA: Diagnosis present

## 2019-12-31 LAB — COMPREHENSIVE METABOLIC PANEL
ALT: 17 IU/L (ref 0–32)
AST: 20 IU/L (ref 0–40)
Albumin/Globulin Ratio: 2 (ref 1.2–2.2)
Albumin: 4.6 g/dL (ref 3.8–4.9)
Alkaline Phosphatase: 78 IU/L (ref 39–117)
BUN/Creatinine Ratio: 23 (ref 9–23)
BUN: 21 mg/dL (ref 6–24)
Bilirubin Total: 0.5 mg/dL (ref 0.0–1.2)
CO2: 26 mmol/L (ref 20–29)
Calcium: 9.8 mg/dL (ref 8.7–10.2)
Chloride: 103 mmol/L (ref 96–106)
Creatinine, Ser: 0.9 mg/dL (ref 0.57–1.00)
GFR calc Af Amer: 81 mL/min/{1.73_m2} (ref >59)
GFR calc non Af Amer: 70 mL/min/{1.73_m2} (ref >59)
Globulin, Total: 2.3 g/dL (ref 1.5–4.5)
Glucose: 93 mg/dL (ref 65–99)
Potassium: 4.6 mmol/L (ref 3.5–5.2)
Sodium: 141 mmol/L (ref 134–144)
Total Protein: 6.9 g/dL (ref 6.0–8.5)

## 2019-12-31 LAB — VITAMIN D 25 HYDROXY (VIT D DEFICIENCY, FRACTURES): Vit D, 25-Hydroxy: 35.9 ng/mL (ref 30.0–100.0)

## 2019-12-31 LAB — LIPID PANEL W/O CHOL/HDL RATIO
Cholesterol, Total: 233 mg/dL — ABNORMAL HIGH (ref 100–199)
HDL: 54 mg/dL (ref >39)
LDL Chol Calc (NIH): 145 mg/dL — ABNORMAL HIGH (ref 0–99)
Triglycerides: 187 mg/dL — ABNORMAL HIGH (ref 0–149)
VLDL Cholesterol Cal: 34 mg/dL (ref 5–40)

## 2019-12-31 LAB — TSH: TSH: 2.44 u[IU]/mL (ref 0.450–4.500)

## 2019-12-31 LAB — CBC
Hematocrit: 43.6 % (ref 34.0–46.6)
Hemoglobin: 15.1 g/dL (ref 11.1–15.9)
MCH: 29.3 pg (ref 26.6–33.0)
MCHC: 34.6 g/dL (ref 31.5–35.7)
MCV: 85 fL (ref 79–97)
Platelets: 256 10*3/uL (ref 150–450)
RBC: 5.15 x10E6/uL (ref 3.77–5.28)
RDW: 12.8 % (ref 11.7–15.4)
WBC: 4.2 10*3/uL (ref 3.4–10.8)

## 2019-12-31 LAB — HCV INTERPRETATION

## 2019-12-31 LAB — HCV AB W REFLEX TO QUANT PCR: HCV Ab: <0.1 s/co ratio (ref 0.0–0.9)

## 2019-12-31 LAB — T4, FREE: Free T4: 1.05 ng/dL (ref 0.82–1.77)

## 2019-12-31 MED ORDER — PRAVASTATIN SODIUM 20 MG PO TABS: 20.0000 mg | ORAL_TABLET | Freq: Every day | ORAL | 1 refills | Status: DC

## 2019-12-31 NOTE — Progress Notes (Signed)
Negative mammogram imaging.

## 2020-01-01 NOTE — Progress Notes (Signed)
Labs normal. Discuss at next visit

## 2020-01-22 ENCOUNTER — Ambulatory Visit: Payer: Managed Care, Other (non HMO) | Attending: Internal Medicine

## 2020-01-22 DIAGNOSIS — Z23 Encounter for immunization: Secondary | ICD-10-CM

## 2020-01-22 NOTE — Progress Notes (Signed)
   Covid-19 Vaccination Clinic  Name:  Penny Hensley    MRN: [id number] DOB: Dec 24, 1960  01/22/2020  Penny Hensley was observed post Covid-19 immunization for 15 minutes without incident. She was provided with Vaccine Information Sheet and instruction to access the V-Safe system.   Penny Hensley was instructed to call 911 with any severe reactions post vaccine: Marland Kitchen Difficulty breathing  . Swelling of face and throat  . A fast heartbeat  . A bad rash all over body  . Dizziness and weakness   Immunizations Administered    Name Date Dose VIS Date Route   Pfizer COVID-19 Vaccine 01/22/2020 11:01 AM 0.3 mL 10/03/2019 Intramuscular   Manufacturer: [employer], [employer]   Lot: [id number]   NDC: [address]-0

## 2020-01-29 ENCOUNTER — Telehealth: Payer: Self-pay

## 2020-01-29 NOTE — Telephone Encounter (Signed)
Confirmed and screened for 02-02-20 ov. 

## 2020-02-02 ENCOUNTER — Encounter: Payer: Self-pay | Admitting: Nurse Practitioner

## 2020-02-02 ENCOUNTER — Other Ambulatory Visit: Payer: Self-pay

## 2020-02-02 ENCOUNTER — Ambulatory Visit: Payer: Managed Care, Other (non HMO) | Admitting: Nurse Practitioner

## 2020-02-02 VITALS — BP 136/76 | HR 53 | Temp 97.4°F | Resp 16 | Ht 70.0 in | Wt 184.4 lb

## 2020-02-02 DIAGNOSIS — E782 Mixed hyperlipidemia: Secondary | ICD-10-CM | POA: Diagnosis not present

## 2020-02-02 DIAGNOSIS — N281 Cyst of kidney, acquired: Secondary | ICD-10-CM | POA: Diagnosis not present

## 2020-02-02 DIAGNOSIS — M4316 Spondylolisthesis, lumbar region: Secondary | ICD-10-CM | POA: Diagnosis not present

## 2020-02-02 NOTE — Progress Notes (Signed)
[facility] [address] [place], [place] [address]  Internal MEDICINE  Office Visit Note  Patient Name: Penny Hensley  [id number]  [id number]  Date of Service: 02/11/2020  Chief Complaint  Patient presents with  . Hyperlipidemia  . Medication Management    gabapentin worked well but pt wanted to know if she sould continue taking it     The patient is here for routine follow up visit. Had been started on gabapentin due to "itching" sensation she was feeling in her shoulders and arms. She continue to take this aand is no longer having the negative sensation. Would like to come off of this. She is currently taking this daily.  She had routine, fasting labs done prior to this visit. LDL, triglycerides, and total cholesterol all mildly elevated. Discussed dietary changes she can make. Suggested she add fish oil supplement to help lower this. Screening mammogram was negative .      Current Medication: Outpatient Encounter Medications as of 02/02/2020  Medication Sig  . fluocinonide (LIDEX) 0.05 % external solution Apply 1 application topically 2 (two) times daily.  . furosemide (LASIX) 20 MG tablet Take 1 tablet (20 mg total) by mouth daily as needed.  . gabapentin (NEURONTIN) 100 MG capsule TAKE 1 CAPSULE BY MOUTH AT BEDTIME.  Marland Kitchen ibuprofen (ADVIL,MOTRIN) 800 MG tablet Take 1 tablet (800 mg total) by mouth every 8 (eight) hours as needed.  . Multiple Vitamin (MULTI-VITAMINS) TABS Take by mouth.  Marland Kitchen omeprazole (PRILOSEC) 20 MG capsule Take 1 capsule (20 mg total) by mouth daily.  . pravastatin (PRAVACHOL) 20 MG tablet Take 1 tablet (20 mg total) by mouth daily.  . [DISCONTINUED] ciprofloxacin (CIPRO) 500 MG tablet Take 1 tablet (500 mg total) by mouth 2 (two) times daily. (Patient not taking: Reported on 02/02/2020)   No facility-administered encounter medications on file as of 02/02/2020.    Surgical History: Past Surgical History:  Procedure Laterality Date  .  CHOLECYSTECTOMY      Medical History: Past Medical History:  Diagnosis Date  . Cholecystitis   . Cholelithiasis   . Hyperlipidemia     Family History: Family History  Problem Relation Age of Onset  . Thyroid disease Mother   . Hyperlipidemia Father   . Heart disease Father   . Thyroid disease Maternal Grandmother     Social History   Socioeconomic History  . Marital status: Married    Spouse name: Not on file  . Number of children: Not on file  . Years of education: Not on file  . Highest education level: Not on file  Occupational History  . Not on file  Tobacco Use  . Smoking status: Never Smoker  . Smokeless tobacco: Never Used  Substance and Sexual Activity  . Alcohol use: Yes    Comment: very rarely   . Drug use: No  . Sexual activity: Not on file  Other Topics Concern  . Not on file  Social History Narrative  . Not on file   Social Determinants of Health   Financial Resource Strain:   . Difficulty of Paying Living Expenses:   Food Insecurity:   . Worried About Programme researcher, broadcasting/film/video in the Last Year:   . Barista in the Last Year:   Transportation Needs:   . Freight forwarder (Medical):   Marland Kitchen Lack of Transportation (Non-Medical):   Physical Activity:   . Days of Exercise per Week:   . Minutes of Exercise per  Session:   Stress:   . Feeling of Stress :   Social Connections:   . Frequency of Communication with Friends and Family:   . Frequency of Social Gatherings with Friends and Family:   . Attends Religious Services:   . Active Member of Clubs or Organizations:   . Attends Archivist Meetings:   Marland Kitchen Marital Status:   Intimate Partner Violence:   . Fear of Current or Ex-Partner:   . Emotionally Abused:   Marland Kitchen Physically Abused:   . Sexually Abused:       Review of Systems  Constitutional: Negative for chills, fatigue and unexpected weight change.  HENT: Negative for congestion, postnasal drip, rhinorrhea, sneezing and sore  throat.   Respiratory: Negative for cough, chest tightness, shortness of breath and wheezing.   Cardiovascular: Negative for chest pain and palpitations.  Gastrointestinal: Negative for abdominal pain, constipation, diarrhea, nausea and vomiting.  Endocrine: Negative for cold intolerance, heat intolerance, polydipsia and polyuria.  Genitourinary: Negative for dysuria, flank pain, frequency and urgency.  Musculoskeletal: Positive for back pain and neck pain. Negative for arthralgias and joint swelling.       The patient states that she is no longer experiencing the tingling and "itching" sensation in her arms.   Skin: Negative for rash.  Allergic/Immunologic: Negative for environmental allergies.  Neurological: Negative for dizziness, tremors, numbness and headaches.  Hematological: Negative for adenopathy. Does not bruise/bleed easily.  Psychiatric/Behavioral: Negative for behavioral problems (Depression), dysphoric mood, sleep disturbance and suicidal ideas. The patient is not nervous/anxious.     Today's Vitals   02/02/20 0957  BP: 136/76  Pulse: (!) 53  Resp: 16  Temp: (!) 97.4 F (36.3 C)  SpO2: 99%  Weight: 184 lb 6.4 oz (83.6 kg)  Height: 5\' 10"  (1.778 m)   Body mass index is 26.46 kg/m.  Physical Exam Vitals and nursing note reviewed.  Constitutional:      General: She is not in acute distress.    Appearance: Normal appearance. She is well-developed. She is not diaphoretic.  HENT:     Head: Normocephalic and atraumatic.     Nose: Nose normal.     Mouth/Throat:     Pharynx: No oropharyngeal exudate.  Eyes:     Pupils: Pupils are equal, round, and reactive to light.  Neck:     Thyroid: No thyromegaly.     Vascular: No carotid bruit or JVD.     Trachea: No tracheal deviation.  Cardiovascular:     Rate and Rhythm: Normal rate and regular rhythm.     Heart sounds: Normal heart sounds. No murmur. No friction rub. No gallop.   Pulmonary:     Effort: Pulmonary effort  is normal. No respiratory distress.     Breath sounds: Normal breath sounds. No wheezing or rales.  Chest:     Chest wall: No tenderness.  Abdominal:     Palpations: Abdomen is soft.  Musculoskeletal:        General: Normal range of motion.     Cervical back: Normal range of motion and neck supple.  Lymphadenopathy:     Cervical: No cervical adenopathy.  Skin:    General: Skin is warm and dry.  Neurological:     Mental Status: She is alert and oriented to person, place, and time.     Cranial Nerves: No cranial nerve deficit.  Psychiatric:        Mood and Affect: Mood normal.  Behavior: Behavior normal.        Thought Content: Thought content normal.        Judgment: Judgment normal.    Assessment/Plan: 1. Mixed hyperlipidemia Mild, generalized elevation of lipid panel. Continue statin therapy as prescribed   2. Acquired renal cyst of right kidney Will continue to monitor.   3. Spondylolisthesis of lumbar region Improved. Instructions reviewed to wean off use of gabapentin. Patient to notify the office if she has any difficulties or concerns with this.   General Counseling: Analena verbalizes understanding of the findings of todays visit and agrees with plan of treatment. I have discussed any further diagnostic evaluation that may be needed or ordered today. We also reviewed her medications today. she has been encouraged to call the office with any questions or concerns that should arise related to todays visit.   This patient was seen by Vincent Gros FNP Collaboration with Dr Lyndon Code as a part of collaborative care agreement  Total time spent: 20 Minutes  Time spent includes review of chart, medications, test results, and follow up plan with the patient.      Dr Lyndon Code Internal medicine

## 2020-02-16 ENCOUNTER — Ambulatory Visit: Payer: Managed Care, Other (non HMO) | Attending: Internal Medicine

## 2020-02-16 DIAGNOSIS — Z23 Encounter for immunization: Secondary | ICD-10-CM

## 2020-02-16 NOTE — Progress Notes (Signed)
   Covid-19 Vaccination Clinic  Name:  Penny Hensley    MRN: [id number] DOB: 1961/06/06  02/16/2020  Penny Hensley was observed post Covid-19 immunization for 15 minutes without incident. She was provided with Vaccine Information Sheet and instruction to access the V-Safe system.   Penny Hensley was instructed to call 911 with any severe reactions post vaccine: Marland Kitchen Difficulty breathing  . Swelling of face and throat  . A fast heartbeat  . A bad rash all over body  . Dizziness and weakness   Immunizations Administered    Name Date Dose VIS Date Route   Pfizer COVID-19 Vaccine 02/16/2020  9:51 AM 0.3 mL 12/17/2018 Intramuscular   Manufacturer: [employer], [employer]   Lot: [id number]   NDC: [address]-0

## 2020-04-05 ENCOUNTER — Other Ambulatory Visit: Payer: Self-pay

## 2020-04-05 DIAGNOSIS — M5000 Cervical disc disorder with myelopathy, unspecified cervical region: Secondary | ICD-10-CM

## 2020-04-05 MED ORDER — IBUPROFEN 800 MG PO TABS: 800.0000 mg | ORAL_TABLET | Freq: Three times a day (TID) | ORAL | 2 refills | Status: AC | PRN

## 2020-08-03 ENCOUNTER — Other Ambulatory Visit: Payer: Self-pay

## 2020-08-03 ENCOUNTER — Ambulatory Visit (INDEPENDENT_AMBULATORY_CARE_PROVIDER_SITE_OTHER): Payer: 59 | Admitting: Nurse Practitioner

## 2020-08-03 VITALS — BP 126/84 | HR 98 | Temp 97.5°F | Resp 16 | Ht 70.0 in | Wt 186.0 lb

## 2020-08-03 DIAGNOSIS — R300 Dysuria: Secondary | ICD-10-CM | POA: Diagnosis not present

## 2020-08-03 DIAGNOSIS — Z0001 Encounter for general adult medical examination with abnormal findings: Secondary | ICD-10-CM | POA: Diagnosis not present

## 2020-08-03 DIAGNOSIS — Z124 Encounter for screening for malignant neoplasm of cervix: Secondary | ICD-10-CM

## 2020-08-03 DIAGNOSIS — E782 Mixed hyperlipidemia: Secondary | ICD-10-CM | POA: Diagnosis not present

## 2020-08-03 NOTE — Progress Notes (Signed)
[facility] [address] [place], [place] [address]  Internal MEDICINE  Office Visit Note  Patient Name: Penny Hensley  [id number]  [id number]  Date of Service: 08/18/2020    Pt is here for routine health maintenance examination  Chief Complaint  Patient presents with  . Annual Exam  . Hyperlipidemia  . controlled substance form    reviewed with PT  . Quality Metric Gaps    tetnaus     The patient is here for health maintenance exam and pap smear. She has recently started back to work. Has brought paperwork with her to give to her employer stating that she has had annual physical.  The patient had routine, fating labs done 12/2019. She had mild, generalized elevation of lipid panel. She takes pravastatin every day. Her screening mammogram was done 12/31/2019 and was negative.    Current Medication: Outpatient Encounter Medications as of 08/03/2020  Medication Sig  . fluocinonide (LIDEX) 0.05 % external solution Apply 1 application topically 2 (two) times daily.  . furosemide (LASIX) 20 MG tablet Take 1 tablet (20 mg total) by mouth daily as needed.  . gabapentin (NEURONTIN) 100 MG capsule TAKE 1 CAPSULE BY MOUTH AT BEDTIME.  Marland Kitchen ibuprofen (ADVIL) 800 MG tablet Take 1 tablet (800 mg total) by mouth every 8 (eight) hours as needed.  . Multiple Vitamin (MULTI-VITAMINS) TABS Take by mouth.  Marland Kitchen omeprazole (PRILOSEC) 20 MG capsule Take 1 capsule (20 mg total) by mouth daily.  . pravastatin (PRAVACHOL) 20 MG tablet Take 1 tablet (20 mg total) by mouth daily.   No facility-administered encounter medications on file as of 08/03/2020.    Surgical History: Past Surgical History:  Procedure Laterality Date  . CHOLECYSTECTOMY      Medical History: Past Medical History:  Diagnosis Date  . Cholecystitis   . Cholelithiasis   . Hyperlipidemia     Family History: Family History  Problem Relation Age of Onset  . Thyroid disease Mother   . Hyperlipidemia Father    . Heart disease Father   . Thyroid disease Maternal Grandmother       Review of Systems  Constitutional: Negative for activity change, chills, fatigue and unexpected weight change.  HENT: Negative for congestion, postnasal drip, rhinorrhea, sneezing and sore throat.   Respiratory: Negative for cough, chest tightness, shortness of breath and wheezing.   Cardiovascular: Negative for chest pain and palpitations.  Gastrointestinal: Negative for abdominal pain, constipation, diarrhea, nausea and vomiting.  Endocrine: Negative for cold intolerance, heat intolerance, polydipsia and polyuria.  Genitourinary: Negative for dysuria, frequency and vaginal discharge.  Musculoskeletal: Negative for arthralgias, back pain, joint swelling and neck pain.  Skin: Negative for rash.  Allergic/Immunologic: Negative for environmental allergies.  Neurological: Negative for dizziness, tremors, numbness and headaches.  Hematological: Negative for adenopathy. Does not bruise/bleed easily.  Psychiatric/Behavioral: Negative for behavioral problems (Depression), sleep disturbance and suicidal ideas. The patient is not nervous/anxious.      Today's Vitals   08/03/20 0928  BP: 126/84  Pulse: 98  Resp: 16  Temp: (!) 97.5 F (36.4 C)  SpO2: 99%  Weight: 186 lb (84.4 kg)  Height: 5\' 10"  (1.778 m)   Body mass index is 26.69 kg/m.  Physical Exam Vitals and nursing note reviewed.  Constitutional:      General: She is not in acute distress.    Appearance: Normal appearance. She is well-developed. She is not diaphoretic.  HENT:     Head: Normocephalic and atraumatic.  Nose: Nose normal.     Mouth/Throat:     Pharynx: No oropharyngeal exudate.  Eyes:     Pupils: Pupils are equal, round, and reactive to light.  Neck:     Thyroid: No thyromegaly.     Vascular: No carotid bruit or JVD.     Trachea: No tracheal deviation.  Cardiovascular:     Rate and Rhythm: Normal rate and regular rhythm.      Pulses: Normal pulses.     Heart sounds: Normal heart sounds. No murmur heard.  No friction rub. No gallop.   Pulmonary:     Effort: Pulmonary effort is normal. No respiratory distress.     Breath sounds: Normal breath sounds. No wheezing or rales.  Chest:     Chest wall: No tenderness.     Breasts:        Right: Normal. No swelling, bleeding, inverted nipple, mass, nipple discharge, skin change or tenderness.        Left: Normal. No swelling, bleeding, inverted nipple, mass, nipple discharge, skin change or tenderness.  Abdominal:     General: Bowel sounds are normal.     Palpations: Abdomen is soft.     Tenderness: There is no abdominal tenderness.     Hernia: There is no hernia in the left inguinal area or right inguinal area.  Genitourinary:    Labia:        Right: No tenderness or lesion.        Left: No tenderness or lesion.      Vagina: Normal. No vaginal discharge, erythema, tenderness or bleeding.     Cervix: No cervical motion tenderness, discharge, friability, lesion or erythema.     Uterus: Normal.      Adnexa: Right adnexa normal and left adnexa normal.     Comments: No tenderness, masses, or organomeglay present during bimanual exam . Musculoskeletal:        General: Normal range of motion.     Cervical back: Normal range of motion and neck supple.  Lymphadenopathy:     Cervical: No cervical adenopathy.     Upper Body:     Right upper body: No axillary adenopathy.     Left upper body: No axillary adenopathy.     Lower Body: No right inguinal adenopathy. No left inguinal adenopathy.  Skin:    General: Skin is warm and dry.  Neurological:     Mental Status: She is alert and oriented to person, place, and time.     Cranial Nerves: No cranial nerve deficit.  Psychiatric:        Behavior: Behavior normal.        Thought Content: Thought content normal.        Judgment: Judgment normal.      LABS: Recent Results (from the past 2160 hour(s))  UA/M w/rflx  Culture, Routine     Status: None   Collection Time: 08/03/20  9:40 AM   Specimen: Urine   Urine  Result Value Ref Range   Specific Gravity, UA 1.020 1.005 - 1.030   pH, UA 6.0 5.0 - 7.5   Color, UA Yellow Yellow   Appearance Ur Clear Clear   Leukocytes,UA Negative Negative   Protein,UA Negative Negative/Trace   Glucose, UA Negative Negative   Ketones, UA Negative Negative   RBC, UA Negative Negative   Bilirubin, UA Negative Negative   Urobilinogen, Ur 0.2 0.2 - 1.0 mg/dL   Nitrite, UA Negative Negative   Microscopic Examination Comment  Comment: Microscopic follows if indicated.   Microscopic Examination See below:     Comment: Microscopic was indicated and was performed.   Urinalysis Reflex Comment     Comment: This specimen will not reflex to a Urine Culture.  IGP, Aptima HPV     Status: None   Collection Time: 08/03/20  9:40 AM  Result Value Ref Range   Interpretation NILM     Comment: NEGATIVE FOR INTRAEPITHELIAL LESION OR MALIGNANCY.   Category NIL     Comment: Negative for Intraepithelial Lesion   Adequacy ENDO     Comment: Satisfactory for evaluation. Endocervical and/or squamous metaplastic cells (endocervical component) are present.    Clinician Provided ICD10 Comment     Comment: Z12.4   Performed by: Comment     Comment: Retia Passe, Cytotechnologist (ASCP)   Note: Comment     Comment: The Pap smear is a screening test designed to aid in the detection of premalignant and malignant conditions of the uterine cervix.  It is not a diagnostic procedure and should not be used as the sole means of detecting cervical cancer.  Both false-positive and false-negative reports do occur.    Test Methodology Comment     Comment: This liquid based ThinPrep(R) pap test was screened with the use of an image guided system.    HPV Aptima Negative Negative    Comment: This nucleic acid amplification test detects fourteen high-risk HPV types  (16,18,31,33,35,39,45,51,52,56,58,59,66,68) without differentiation.   Microscopic Examination     Status: Abnormal   Collection Time: 08/03/20  9:40 AM   Urine  Result Value Ref Range   WBC, UA 0-5 0 - 5 /hpf   RBC 0-2 0 - 2 /hpf   Epithelial Cells (non renal) 0-10 0 - 10 /hpf   Casts None seen None seen /lpf   Crystals Present (A) N/A   Crystal Type Calcium Oxalate N/A   Bacteria, UA None seen None seen/Few    Assessment/Plan: 1. Encounter for general adult medical examination with abnormal findings Annual health maintenance exam with pap smear today.   2. Mixed hyperlipidemia Check fasting lipid panel prior to next visit and adjust pravastatin dosing as indicated.   3. Routine cervical smear - IGP, Aptima HPV  4. Dysuria - UA/M w/rflx Culture, Routine  General Counseling: Aliyha verbalizes understanding of the findings of todays visit and agrees with plan of treatment. I have discussed any further diagnostic evaluation that may be needed or ordered today. We also reviewed her medications today. she has been encouraged to call the office with any questions or concerns that should arise related to todays visit.    Counseling:  This patient was seen by Vincent Gros FNP Collaboration with Dr Lyndon Code as a part of collaborative care agreement  Orders Placed This Encounter  Procedures  . Microscopic Examination  . UA/M w/rflx Culture, Routine     Total time spent: 45 Minutes  Time spent includes review of chart, medications, test results, and follow up plan with the patient.     Lyndon Code, MD  Internal Medicine

## 2020-08-04 LAB — MICROSCOPIC EXAMINATION
Bacteria, UA: NONE SEEN
Casts: NONE SEEN /lpf

## 2020-08-04 LAB — UA/M W/RFLX CULTURE, ROUTINE
Bilirubin, UA: NEGATIVE
Glucose, UA: NEGATIVE
Ketones, UA: NEGATIVE
Leukocytes,UA: NEGATIVE
Nitrite, UA: NEGATIVE
Protein,UA: NEGATIVE
RBC, UA: NEGATIVE
Specific Gravity, UA: 1.02 (ref 1.005–1.030)
Urobilinogen, Ur: 0.2 mg/dL (ref 0.2–1.0)
pH, UA: 6 (ref 5.0–7.5)

## 2020-08-07 LAB — IGP, APTIMA HPV: HPV Aptima: NEGATIVE

## 2020-08-18 ENCOUNTER — Encounter: Payer: Self-pay | Admitting: Nurse Practitioner

## 2020-08-18 DIAGNOSIS — Z124 Encounter for screening for malignant neoplasm of cervix: Secondary | ICD-10-CM | POA: Insufficient documentation

## 2020-09-27 ENCOUNTER — Other Ambulatory Visit: Payer: Self-pay

## 2020-09-27 MED ORDER — PRAVASTATIN SODIUM 20 MG PO TABS
20.0000 mg | ORAL_TABLET | Freq: Every day | ORAL | 1 refills | Status: DC
Start: 2020-09-27 — End: 2021-02-02

## 2020-12-02 ENCOUNTER — Other Ambulatory Visit: Payer: Self-pay

## 2020-12-02 MED ORDER — OMEPRAZOLE 20 MG PO CPDR
20.0000 mg | DELAYED_RELEASE_CAPSULE | Freq: Every day | ORAL | 3 refills | Status: DC
Start: 2020-12-02 — End: 2021-02-02

## 2020-12-08 ENCOUNTER — Ambulatory Visit (INDEPENDENT_AMBULATORY_CARE_PROVIDER_SITE_OTHER): Payer: BC Managed Care – PPO

## 2020-12-08 ENCOUNTER — Other Ambulatory Visit: Payer: Self-pay

## 2020-12-08 ENCOUNTER — Ambulatory Visit: Payer: BC Managed Care – PPO | Admitting: Podiatry

## 2020-12-08 ENCOUNTER — Encounter: Payer: Self-pay | Admitting: Podiatry

## 2020-12-08 DIAGNOSIS — S96911A Strain of unspecified muscle and tendon at ankle and foot level, right foot, initial encounter: Secondary | ICD-10-CM | POA: Diagnosis not present

## 2020-12-08 DIAGNOSIS — M7661 Achilles tendinitis, right leg: Secondary | ICD-10-CM

## 2020-12-08 DIAGNOSIS — M21611 Bunion of right foot: Secondary | ICD-10-CM

## 2020-12-08 DIAGNOSIS — M2011 Hallux valgus (acquired), right foot: Secondary | ICD-10-CM

## 2020-12-08 NOTE — Patient Instructions (Signed)

## 2020-12-09 ENCOUNTER — Encounter: Payer: Self-pay | Admitting: Podiatry

## 2020-12-09 NOTE — Progress Notes (Signed)
  Subjective:  Patient ID: Penny Hensley, female    DOB: 1961-08-28,  MRN: [id number]  Chief Complaint  Patient presents with  . Tendonitis    Patient presents today for painful know on back of right achilles and burning pains in arch at base of 1st mpj x 2-3 weeks    60 y.o. female presents with the above complaint. History confirmed with patient.   Objective:  Physical Exam: warm, good capillary refill, no trophic changes or ulcerative lesions, normal DP and PT pulses and normal sensory exam.  Right Foot: Pain on palpation of the distal Achilles tendon more so laterally, there is a palpable bony prominence on the posterior lateral calcaneus, she has mild pain along the abductor hallucis, she has moderate to severe hallux valgus with tension of the abductor hallucis with lateral deviation of the hallux  Radiographs: X-ray of the right foot: Moderate hallux valgus with increased soft tissue density on the medial first MTPJ, she has a very small posterior calcaneal enthesophyte Assessment:   1. Achilles tendinitis of right lower extremity   2. Hallux valgus with bunions, right   3. Muscle strain of foot, right, initial encounter      Plan:  Patient was evaluated and treated and all questions answered.  Discussed with her the majority of her pain is accommodation of insertional Achilles tendinitis as well as a "bump".  She has prominent posterior calcaneal spur.  I recommended for this she used to use Tuli's heel cups as well as beginning stretching and strengthening exercises for Achilles tendinitis.  The pain she is having in the distal medial forefoot I think is likely strain and fatigue of the abductor hallucis muscle providing her moderate to severe hallux valgus.  We discussed correction of hallux valgus and this is a be going way to correct the deformity itself.  Today we dispensed a adduction bunion splint to see if this alleviates pressure when she is doing exercise and  walking. Return in about 2 months (around 02/05/2021).

## 2020-12-10 ENCOUNTER — Telehealth: Payer: Self-pay

## 2020-12-10 NOTE — Telephone Encounter (Signed)
Patient called and was wanting to know when the antiinflammatory medication will be called in.  I looked at your note and it did not say anything about medication.  Please advise

## 2020-12-15 ENCOUNTER — Telehealth: Payer: Self-pay

## 2020-12-15 MED ORDER — MELOXICAM 15 MG PO TABS: 15.0000 mg | ORAL_TABLET | Freq: Every day | ORAL | 1 refills | Status: DC

## 2020-12-15 NOTE — Telephone Encounter (Signed)
Per Dr. Milas Gain verbal order, ok to send in Meloxicam.   New script has been sent to [facility]

## 2021-02-01 ENCOUNTER — Ambulatory Visit: Payer: 59 | Admitting: Hospice and Palliative Medicine

## 2021-02-02 ENCOUNTER — Other Ambulatory Visit: Payer: Self-pay

## 2021-02-02 ENCOUNTER — Encounter: Payer: Self-pay | Admitting: Nurse Practitioner

## 2021-02-02 ENCOUNTER — Ambulatory Visit (INDEPENDENT_AMBULATORY_CARE_PROVIDER_SITE_OTHER): Payer: BC Managed Care – PPO | Admitting: Nurse Practitioner

## 2021-02-02 VITALS — BP 115/69 | HR 57 | Temp 98.8°F | Ht 70.0 in | Wt 185.6 lb

## 2021-02-02 DIAGNOSIS — Z1211 Encounter for screening for malignant neoplasm of colon: Secondary | ICD-10-CM | POA: Insufficient documentation

## 2021-02-02 DIAGNOSIS — Z7689 Persons encountering health services in other specified circumstances: Secondary | ICD-10-CM | POA: Diagnosis not present

## 2021-02-02 DIAGNOSIS — E669 Obesity, unspecified: Secondary | ICD-10-CM

## 2021-02-02 DIAGNOSIS — K219 Gastro-esophageal reflux disease without esophagitis: Secondary | ICD-10-CM

## 2021-02-02 DIAGNOSIS — G8929 Other chronic pain: Secondary | ICD-10-CM

## 2021-02-02 DIAGNOSIS — R600 Localized edema: Secondary | ICD-10-CM

## 2021-02-02 DIAGNOSIS — Z0000 Encounter for general adult medical examination without abnormal findings: Secondary | ICD-10-CM

## 2021-02-02 DIAGNOSIS — Z1231 Encounter for screening mammogram for malignant neoplasm of breast: Secondary | ICD-10-CM

## 2021-02-02 DIAGNOSIS — M25512 Pain in left shoulder: Secondary | ICD-10-CM

## 2021-02-02 DIAGNOSIS — E782 Mixed hyperlipidemia: Secondary | ICD-10-CM

## 2021-02-02 DIAGNOSIS — E559 Vitamin D deficiency, unspecified: Secondary | ICD-10-CM | POA: Insufficient documentation

## 2021-02-02 MED ORDER — PRAVASTATIN SODIUM 20 MG PO TABS: 20.0000 mg | ORAL_TABLET | Freq: Every day | ORAL | 3 refills | Status: DC

## 2021-02-02 MED ORDER — FUROSEMIDE 20 MG PO TABS: 20.0000 mg | ORAL_TABLET | Freq: Every day | ORAL | 1 refills | Status: DC | PRN

## 2021-02-02 MED ORDER — OMEPRAZOLE 20 MG PO CPDR: 20.0000 mg | DELAYED_RELEASE_CAPSULE | Freq: Every day | ORAL | 3 refills | Status: DC

## 2021-02-02 NOTE — Progress Notes (Signed)
New Patient Office Visit  Subjective:  Patient ID: Penny Hensley, female    DOB: April 17, 1961  Age: 60 y.o. MRN: [id number]  CC:  Chief Complaint  Patient presents with  . New Patient (Initial Visit)    HPI Penny Hensley presents to establish new primary care office. She is changing offices to keep current PCP and maintain continuity of care. 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 New job less stressful and more dependable hours. She is concerned about a five pound weight gain over the past few months. Was going to [facility] for weight loss. There, they focused on hormone balance to help with weight loss. She did very well. Had lost to around 177 pounds. She states that she is thinking about going back to them. She states that she continues to maintain healthy, low-calorie diet. She is using her stationary bike a few times every week. She knows she is not quite as active due to the new job.  She sees podiatrist for bony pain on back of right heel. This is mostly tender with palpation. Unable to wear certain shoes because they rub this spot in particular. There is small area of calcification which corresponds with the area of tenderness. Podiatrist gave her prescription for meloxicam to take as needed. She has tried this a few times. States that it hasn't really made much of a difference. Continues to have left shoulder pain. It is difficult for her to adduct the left arm. Fastening her bra and scratching her back are both difficult due to this pain. Over the past year, the pain has not gotten worse or better. She would like to see orthopedic provider for this.  She is due to have fasting blood work. She does have history of high cholesterol.  Due to have screening mammogram.   Past Medical History:  Diagnosis Date  . Cholecystitis   . Cholelithiasis   . Hyperlipidemia     Past Surgical History:  Procedure Laterality Date  . CHOLECYSTECTOMY       Family History  Problem Relation Age of Onset  . Thyroid disease Mother   . Hyperlipidemia Father   . Heart disease Father   . High Cholesterol Father   . Thyroid disease Maternal Grandmother     Social History   Socioeconomic History  . Marital status: Married    Spouse name: Not on file  . Number of children: Not on file  . Years of education: Not on file  . Highest education level: Not on file  Occupational History  . Not on file  Tobacco Use  . Smoking status: Never Smoker  . Smokeless tobacco: Never Used  Substance and Sexual Activity  . Alcohol use: Never    Comment: very rarely   . Drug use: No  . Sexual activity: Not Currently    Partners: Male  Other Topics Concern  . Not on file  Social History Narrative  . Not on file   Social Determinants of Health   Financial Resource Strain: Not on file  Food Insecurity: Not on file  Transportation Needs: Not on file  Physical Activity: Not on file  Stress: Not on file  Social Connections: Not on file  Intimate Partner Violence: Not on file    ROS Review of Systems  Constitutional: Negative for activity change, chills and fever.  HENT: Negative for congestion, postnasal drip, rhinorrhea, sinus pressure and sinus pain.   Eyes: Negative.  Respiratory: Negative for cough, chest tightness and shortness of breath.   Cardiovascular: Negative for chest pain and palpitations.  Gastrointestinal: Negative for constipation, diarrhea, nausea and vomiting.  Endocrine: Negative.   Genitourinary: Negative.   Musculoskeletal: Positive for arthralgias. Negative for back pain and myalgias.       Left shoulder pain.  Right heel pain.   Skin: Negative for rash.  Allergic/Immunologic: Negative for environmental allergies.  Neurological: Negative for dizziness, weakness and headaches.  Hematological: Negative for adenopathy.  Psychiatric/Behavioral: Negative.  The patient is not nervous/anxious.   All other systems  reviewed and are negative.   Objective:   Today's Vitals   02/02/21 0959  BP: 115/69  Pulse: (!) 57  Temp: 98.8 F (37.1 C)  SpO2: 99%  Weight: 185 lb 9.6 oz (84.2 kg)  Height: 5\' 10"  (1.778 m)   Body mass index is 26.63 kg/m.   Physical Exam Vitals and nursing note reviewed.  Constitutional:      Appearance: Normal appearance. She is well-developed.  HENT:     Head: Normocephalic and atraumatic.     Nose: Nose normal.  Eyes:     Conjunctiva/sclera: Conjunctivae normal.     Pupils: Pupils are equal, round, and reactive to light.  Neck:     Vascular: No carotid bruit.  Cardiovascular:     Rate and Rhythm: Normal rate and regular rhythm.     Pulses: Normal pulses.     Heart sounds: Normal heart sounds.  Pulmonary:     Effort: Pulmonary effort is normal.     Breath sounds: Normal breath sounds.  Abdominal:     Palpations: Abdomen is soft.  Musculoskeletal:     Left shoulder: Tenderness present. Decreased range of motion.       Arms:     Cervical back: Normal range of motion and neck supple.       Legs:  Skin:    General: Skin is warm and dry.  Neurological:     Mental Status: She is alert and oriented to person, place, and time.     Assessment & Plan:  1. Encounter to establish care Appointment today to establish care with new primary care office.   2. Mixed hyperlipidemia Check fasting lipid panel and adjust pravastatin as indicated.  - CBC with Differential/Platelet - Comprehensive metabolic panel - Lipid panel - pravastatin (PRAVACHOL) 20 MG tablet; Take 1 tablet (20 mg total) by mouth daily.  Dispense: 90 tablet; Refill: 3  3. Chronic left shoulder pain There is mildly reduced ROM associated with the pain. Referral to orthopedics for further evaluation and treatment.  - Ambulatory referral to Orthopedic Surgery  4. Edema leg May take furosemide 20mg  as needed for swelling in both lower legs.  - furosemide (LASIX) 20 MG tablet; Take 1 tablet (20  mg total) by mouth daily as needed.  Dispense: 90 tablet; Refill: 1  5. Mild obesity Check thyroid panel. Patient considering return to [facility] for weight loss.  - T4, free - TSH  6. Vitamin D deficiency Check vitamin d level.  - Vitamin D 1,25 dihydroxy  7. Gastroesophageal reflux disease without esophagitis May continue omeprazole as prescribed  - omeprazole (PRILOSEC) 20 MG capsule; Take 1 capsule (20 mg total) by mouth daily.  Dispense: 90 capsule; Refill: 3  8. Encounter for screening mammogram for malignant neoplasm of breast Screening mammogram ordered.  - MM DIGITAL SCREENING BILATERAL; Future  9. Healthcare maintenance Routnie, fasting blood work drawn during office visit  today. - CBC with Differential/Platelet - Comprehensive metabolic panel - T4, free - TSH  Problem List Items Addressed This Visit      Digestive   Gastroesophageal reflux disease without esophagitis   Relevant Medications   omeprazole (PRILOSEC) 20 MG capsule     Other   Mixed hyperlipidemia   Relevant Medications   furosemide (LASIX) 20 MG tablet   pravastatin (PRAVACHOL) 20 MG tablet   Other Relevant Orders   CBC with Differential/Platelet   Comprehensive metabolic panel   Lipid panel   Mild obesity   Relevant Orders   T4, free   TSH   Edema leg   Relevant Medications   furosemide (LASIX) 20 MG tablet   Encounter for screening mammogram for malignant neoplasm of breast   Relevant Orders   MM DIGITAL SCREENING BILATERAL   Encounter to establish care - Primary   Chronic left shoulder pain   Relevant Orders   Ambulatory referral to Orthopedic Surgery   Vitamin D deficiency   Relevant Orders   Vitamin D 1,25 dihydroxy   Healthcare maintenance   Relevant Orders   CBC with Differential/Platelet   Comprehensive metabolic panel   T4, free   TSH      Outpatient Encounter Medications as of 02/02/2021  Medication Sig  . calcium citrate (CALCITRATE - DOSED IN MG ELEMENTAL  CALCIUM) 950 (200 Ca) MG tablet Take 1 tablet by mouth daily.  . fluocinonide (LIDEX) 0.05 % external solution Apply 1 application topically 2 (two) times daily.  Marland Kitchen ibuprofen (ADVIL) 800 MG tablet Take 1 tablet (800 mg total) by mouth every 8 (eight) hours as needed.  . meloxicam (MOBIC) 15 MG tablet Take 1 tablet (15 mg total) by mouth daily.  . Multiple Vitamin (MULTI-VITAMINS) TABS Take by mouth.  . [DISCONTINUED] furosemide (LASIX) 20 MG tablet Take 1 tablet (20 mg total) by mouth daily as needed.  . [DISCONTINUED] omeprazole (PRILOSEC) 20 MG capsule Take 1 capsule (20 mg total) by mouth daily.  . [DISCONTINUED] pravastatin (PRAVACHOL) 20 MG tablet Take 1 tablet (20 mg total) by mouth daily.  . furosemide (LASIX) 20 MG tablet Take 1 tablet (20 mg total) by mouth daily as needed.  Marland Kitchen omeprazole (PRILOSEC) 20 MG capsule Take 1 capsule (20 mg total) by mouth daily.  . pravastatin (PRAVACHOL) 20 MG tablet Take 1 tablet (20 mg total) by mouth daily.   No facility-administered encounter medications on file as of 02/02/2021.   Time spent with the patient was approximately 45 minutes. This time included reviewing progress notes, labs, imaging studies, and discussing plan for follow up.   Follow-up: Return in about 6 months (around 08/04/2021) for routine physical .   [name], NP

## 2021-02-02 NOTE — Patient Instructions (Signed)
BMI for Adults What is BMI? Body mass index (BMI) is a number that is calculated from a person's weight and height. BMI can help estimate how much of a person's weight is composed of fat. BMI does not measure body fat directly. Rather, it is an alternative to procedures that directly measure body fat, which can be difficult and expensive. BMI can help identify people who may be at higher risk for certain medical problems. What are BMI measurements used for? BMI is used as a screening tool to identify possible weight problems. It helps determine whether a person is obese, overweight, a healthy weight, or underweight. BMI is useful for:  Identifying a weight problem that may be related to a medical condition or may increase the risk for medical problems.  Promoting changes, such as changes in diet and exercise, to help reach a healthy weight. BMI screening can be repeated to see if these changes are working. How is BMI calculated? BMI involves measuring your weight in relation to your height. Both height and weight are measured, and the BMI is calculated from those numbers. This can be done either in English (U[place] or metric measurements. Note that charts and online BMI calculators are available to help you find your BMI quickly and easily without having to do these calculations yourself. To calculate your BMI in English (U.S[place] measurements: 1. Measure your weight in pounds (lb). 2. Multiply the number of pounds by 703. ? For example, for a person who weighs 180 lb, multiply that number by 703, which equals 126,540. 3. Measure your height in inches. Then multiply that number by itself to get a measurement called "inches squared." ? For example, for a person who is 70 inches tall, the "inches squared" measurement is 70 inches x 70 inches, which equals 4,900 inches squared. 4. Divide the total from step 2 (number of lb x 703) by the total from step 3 (inches squared): 126,540  4,900 = 25.8. This is  your BMI.   To calculate your BMI in metric measurements: 1. Measure your weight in kilograms (kg). 2. Measure your height in meters (m). Then multiply that number by itself to get a measurement called "meters squared." ? For example, for a person who is 1.75 m tall, the "meters squared" measurement is 1.75 m x 1.75 m, which is equal to 3.1 meters squared. 3. Divide the number of kilograms (your weight) by the meters squared number. In this example: 70  3.1 = 22.6. This is your BMI. What do the results mean? BMI charts are used to identify whether you are underweight, normal weight, overweight, or obese. The following guidelines will be used:  Underweight: BMI less than 18.5.  Normal weight: BMI between 18.5 and 24.9.  Overweight: BMI between 25 and 29.9.  Obese: BMI of 30 or above. Keep these notes in mind:  Weight includes both fat and muscle, so someone with a muscular build, such as an athlete, may have a BMI that is higher than 24.9. In cases like these, BMI is not an accurate measure of body fat.  To determine if excess body fat is the cause of a BMI of 25 or higher, further assessments may need to be done by a health care provider.  BMI is usually interpreted in the same way for men and women. Where to find more information For more information about BMI, including tools to quickly calculate your BMI, go to these websites:  Centers for Disease Control and Prevention: www.cdc.gov    American Heart Association: www.heart.org  National Heart, Lung, and Blood Institute: www.nhlbi.nih.gov Summary  Body mass index (BMI) is a number that is calculated from a person's weight and height.  BMI may help estimate how much of a person's weight is composed of fat. BMI can help identify those who may be at higher risk for certain medical problems.  BMI can be measured using English measurements or metric measurements.  BMI charts are used to identify whether you are underweight, normal  weight, overweight, or obese. This information is not intended to replace advice given to you by your health care provider. Make sure you discuss any questions you have with your health care provider. Document Revised: 07/02/2019 Document Reviewed: 05/09/2019 Elsevier Patient Education  2021 Elsevier Inc.  

## 2021-02-03 NOTE — Progress Notes (Signed)
Will recheck calcium and cbc, along with PTH and ionized calcium. Cholesterol moderately elevated. Increase dose statin. Waiting on all results.

## 2021-02-09 ENCOUNTER — Ambulatory Visit: Payer: Self-pay

## 2021-02-09 ENCOUNTER — Ambulatory Visit: Payer: BC Managed Care – PPO | Admitting: Orthopaedic Surgery

## 2021-02-09 ENCOUNTER — Ambulatory Visit: Payer: BC Managed Care – PPO | Admitting: Podiatry

## 2021-02-09 ENCOUNTER — Encounter: Payer: Self-pay | Admitting: Podiatry

## 2021-02-09 ENCOUNTER — Other Ambulatory Visit: Payer: Self-pay

## 2021-02-09 DIAGNOSIS — M7661 Achilles tendinitis, right leg: Secondary | ICD-10-CM

## 2021-02-09 DIAGNOSIS — M25512 Pain in left shoulder: Secondary | ICD-10-CM

## 2021-02-09 DIAGNOSIS — G8929 Other chronic pain: Secondary | ICD-10-CM | POA: Diagnosis not present

## 2021-02-09 NOTE — Addendum Note (Signed)
Addended by: Rip Harbour on: 02/09/2021 09:59 AM   Modules accepted: Orders

## 2021-02-09 NOTE — Progress Notes (Signed)
Office Visit Note   Patient: CASSIE SHEDLOCK           Date of Birth: 1961-05-07           MRN: [id number] Visit Date: 02/09/2021              Requested by: [name], NP [address] [place],  [place] [address] PCP: [name], NP   Assessment & Plan: Visit Diagnoses:  1. Chronic left shoulder pain     Plan: Impression is mild to moderate left shoulder adhesive capsulitis.  Sounds like symptoms have gradually improved although she is still having problems with certain daily activities therefore I recommended shoulder joint injection with outpatient physical therapy at [facility].  We will recheck how she is doing in about 8 weeks.  Follow-Up Instructions: Return in about 8 weeks (around 04/06/2021).   Orders:  Orders Placed This Encounter  Procedures  . XR Shoulder Left   No orders of the defined types were placed in this encounter.     Procedures: No procedures performed   Clinical Data: No additional findings.   Subjective: Chief Complaint  Patient presents with  . Left Shoulder - Pain    Eunice Blase is a very pleasant 60 year old female right-hand-dominant who comes in with chronic left shoulder pain for about 4 months.  Initially she had more pain and more stiffness and with trouble reaching on her back and unhooking her bra.  She denies any injuries or previous surgeries or radiculopathy.  She localizes the pain to the lateral aspect of the shoulder.  She has not noticed any decrease in strength.  Denies diabetes.   Review of Systems  Constitutional: Negative.   HENT: Negative.   Eyes: Negative.   Respiratory: Negative.   Cardiovascular: Negative.   Endocrine: Negative.   Musculoskeletal: Negative.   Neurological: Negative.   Hematological: Negative.   Psychiatric/Behavioral: Negative.   All other systems reviewed and are negative.    Objective: Vital Signs: There were no vitals taken for this visit.  Physical Exam Vitals  and nursing note reviewed.  Constitutional:      Appearance: She is well-developed.  HENT:     Head: Normocephalic and atraumatic.  Pulmonary:     Effort: Pulmonary effort is normal.  Abdominal:     Palpations: Abdomen is soft.  Musculoskeletal:     Cervical back: Neck supple.  Skin:    General: Skin is warm.     Capillary Refill: Capillary refill takes less than 2 seconds.  Neurological:     Mental Status: She is alert and oriented to person, place, and time.  Psychiatric:        Behavior: Behavior normal.        Thought Content: Thought content normal.        Judgment: Judgment normal.     Ortho Exam Left shoulder shows forward flexion to 140 external rotation 45 internal rotation L4 abduction 90 consistent with mild to moderate adhesive capsulitis.  Strength to manual muscle testing is normal and without pain.  Negative Hawkins sign.  Negative Neer sign.  Biceps is nontender.  Specialty Comments:  No specialty comments available.  Imaging: XR Shoulder Left  Result Date: 02/09/2021 Mild arthritic changes to the [facility] joint.  Otherwise no abnormalities.    PMFS History: Patient Active Problem List   Diagnosis Date Noted  . Encounter to establish care 02/02/2021  . Chronic left shoulder pain 02/02/2021  . Vitamin D  deficiency 02/02/2021  . Gastroesophageal reflux disease without esophagitis 02/02/2021  . Healthcare maintenance 02/02/2021  . Routine cervical smear 08/18/2020  . Atopic dermatitis 11/04/2019  . Encounter for screening mammogram for malignant neoplasm of breast 11/04/2019  . Encounter for hepatitis C screening test for low risk patient 11/04/2019  . Bladder prolapse, female, acquired 04/14/2019  . Spondylolisthesis of lumbar region 12/11/2018  . Acquired renal cyst of right kidney 11/02/2018  . Edema leg 11/02/2018  . Dysuria 11/02/2018  . Numbness of right foot 05/20/2018  . Subcutaneous nodule of toe of right foot 05/20/2018  . Mild obesity  04/29/2018  . Degenerative TFCC tear, left 04/15/2018  . Left wrist tendinitis 04/15/2018  . Mixed hyperlipidemia 01/13/2018  . Cervical disc disease with myelopathy 01/13/2018  . Primary insomnia 01/13/2018  . Encounter for general adult medical examination with abnormal findings 01/13/2018  . Chronic pain of left wrist 07/05/2017  . Primary osteoarthritis of first carpometacarpal joint of left hand 05/17/2017  . Chronic pain of left thumb 11/13/2016  . Ds DNA antibody positive 10/30/2016  . Arthralgia of multiple joints 10/30/2016   Past Medical History:  Diagnosis Date  . Cholecystitis   . Cholelithiasis   . Hyperlipidemia     Family History  Problem Relation Age of Onset  . Thyroid disease Mother   . Hyperlipidemia Father   . Heart disease Father   . High Cholesterol Father   . Thyroid disease Maternal Grandmother     Past Surgical History:  Procedure Laterality Date  . CHOLECYSTECTOMY     Social History   Occupational History  . Not on file  Tobacco Use  . Smoking status: Never Smoker  . Smokeless tobacco: Never Used  Substance and Sexual Activity  . Alcohol use: Never    Comment: very rarely   . Drug use: No  . Sexual activity: Not Currently    Partners: Male

## 2021-02-09 NOTE — Progress Notes (Signed)
  Subjective:  Patient ID: Penny Hensley, female    DOB: Nov 20, 1960,  MRN: [id number]  Chief Complaint  Patient presents with  . Tendonitis    "Im still having issues with my heel but the bunion is better"    60 y.o. female presents with the above complaint. History confirmed with patient.   Objective:  Physical Exam: warm, good capillary refill, no trophic changes or ulcerative lesions, normal DP and PT pulses and normal sensory exam.  Right Foot: Pain on palpation of the distal Achilles tendon more so laterally, there is a palpable bony prominence on the posterior lateral calcaneus,  Radiographs: X-ray of the right foot: Moderate hallux valgus with increased soft tissue density on the medial first MTPJ, she has a very small posterior calcaneal enthesophyte Assessment:   1. Achilles tendinitis of right lower extremity      Plan:  Patient was evaluated and treated and all questions answered.  Discussed the etiology and treatment options for Achilles tendinitis including stretching, formal physical therapy, supportive shoegears such as a running shoe or sneaker, bracing, and oral medications. We also discussed the role of surgical treatment of this for patients who do not improve after exhausting non-surgical treatment options.   Achilles Tendonitis -XR reviewed with patient -Educated on stretching and icing of the affected limb. -Referral placed to physical therapy. -Motrin OTC at home for pain PRN  Return in about 6 weeks (around 03/23/2021) for re-check Achilles tendonitis after PT .

## 2021-02-10 LAB — CBC WITH DIFFERENTIAL/PLATELET
Basophils Absolute: 0 10*3/uL (ref 0.0–0.2)
Basos: 1 %
EOS (ABSOLUTE): 0.1 10*3/uL (ref 0.0–0.4)
Eos: 1 %
Hematocrit: 47.2 % — ABNORMAL HIGH (ref 34.0–46.6)
Hemoglobin: 16 g/dL — ABNORMAL HIGH (ref 11.1–15.9)
Immature Grans (Abs): 0 10*3/uL (ref 0.0–0.1)
Immature Granulocytes: 0 %
Lymphocytes Absolute: 1.6 10*3/uL (ref 0.7–3.1)
Lymphs: 27 %
MCH: 29.1 pg (ref 26.6–33.0)
MCHC: 33.9 g/dL (ref 31.5–35.7)
MCV: 86 fL (ref 79–97)
Monocytes Absolute: 0.4 10*3/uL (ref 0.1–0.9)
Monocytes: 7 %
Neutrophils Absolute: 3.8 10*3/uL (ref 1.4–7.0)
Neutrophils: 64 %
Platelets: 319 10*3/uL (ref 150–450)
RBC: 5.49 x10E6/uL — ABNORMAL HIGH (ref 3.77–5.28)
RDW: 12.8 % (ref 11.7–15.4)
WBC: 6 10*3/uL (ref 3.4–10.8)

## 2021-02-10 LAB — COMPREHENSIVE METABOLIC PANEL
ALT: 27 IU/L (ref 0–32)
AST: 30 IU/L (ref 0–40)
Albumin/Globulin Ratio: 1.9 (ref 1.2–2.2)
Albumin: 5.1 g/dL — ABNORMAL HIGH (ref 3.8–4.9)
Alkaline Phosphatase: 97 IU/L (ref 44–121)
BUN/Creatinine Ratio: 23 (ref 12–28)
BUN: 23 mg/dL (ref 8–27)
Bilirubin Total: 0.5 mg/dL (ref 0.0–1.2)
CO2: 22 mmol/L (ref 20–29)
Calcium: 10.6 mg/dL — ABNORMAL HIGH (ref 8.7–10.3)
Chloride: 103 mmol/L (ref 96–106)
Creatinine, Ser: 0.99 mg/dL (ref 0.57–1.00)
Globulin, Total: 2.7 g/dL (ref 1.5–4.5)
Glucose: 97 mg/dL (ref 65–99)
Potassium: 5 mmol/L (ref 3.5–5.2)
Sodium: 142 mmol/L (ref 134–144)
Total Protein: 7.8 g/dL (ref 6.0–8.5)
eGFR: 65 mL/min/{1.73_m2} (ref >59)

## 2021-02-10 LAB — VITAMIN D 1,25 DIHYDROXY
Vitamin D 1, 25 (OH)2 Total: 38 pg/mL
Vitamin D2 1, 25 (OH)2: <10 pg/mL
Vitamin D3 1, 25 (OH)2: 28 pg/mL

## 2021-02-10 LAB — LIPID PANEL
Chol/HDL Ratio: 4.5 ratio — ABNORMAL HIGH (ref 0.0–4.4)
Cholesterol, Total: 238 mg/dL — ABNORMAL HIGH (ref 100–199)
HDL: 53 mg/dL (ref >39)
LDL Chol Calc (NIH): 145 mg/dL — ABNORMAL HIGH (ref 0–99)
Triglycerides: 224 mg/dL — ABNORMAL HIGH (ref 0–149)
VLDL Cholesterol Cal: 40 mg/dL (ref 5–40)

## 2021-02-10 LAB — TSH: TSH: 2.2 u[IU]/mL (ref 0.450–4.500)

## 2021-02-10 LAB — T4, FREE: Free T4: 1.12 ng/dL (ref 0.82–1.77)

## 2021-03-23 ENCOUNTER — Encounter: Payer: Self-pay | Admitting: Podiatry

## 2021-03-23 ENCOUNTER — Other Ambulatory Visit: Payer: Self-pay

## 2021-03-23 ENCOUNTER — Ambulatory Visit: Payer: BC Managed Care – PPO | Admitting: Podiatry

## 2021-03-23 DIAGNOSIS — M2011 Hallux valgus (acquired), right foot: Secondary | ICD-10-CM

## 2021-03-23 DIAGNOSIS — M21611 Bunion of right foot: Secondary | ICD-10-CM

## 2021-03-23 DIAGNOSIS — M7661 Achilles tendinitis, right leg: Secondary | ICD-10-CM

## 2021-03-23 NOTE — Progress Notes (Signed)
  Subjective:  Patient ID: Penny Hensley, female    DOB: 1961-07-26,  MRN: [id number]  Chief Complaint  Patient presents with  . Tendonitis    RE-CHECK ACHILLES TENDINITIS AFTER PT    60 y.o. female presents with the above complaint. History confirmed with patient. Overall much better and is pain free now  Objective:  Physical Exam: warm, good capillary refill, no trophic changes or ulcerative lesions, normal DP and PT pulses and normal sensory exam.  Right Foot: No pain in achilles, 5/5 strength in PF  Radiographs: X-ray of the right foot: Moderate hallux valgus with increased soft tissue density on the medial first MTPJ, she has a very small posterior calcaneal enthesophyte Assessment:   1. Achilles tendinitis of right lower extremity   2. Hallux valgus with bunions, right      Plan:  Patient was evaluated and treated and all questions answered.   Overall doing much better, no pain. Can discontinue PT at this point.   Return if symptoms worsen or fail to improve.

## 2021-04-06 ENCOUNTER — Ambulatory Visit: Payer: BC Managed Care – PPO | Admitting: Orthopaedic Surgery

## 2021-04-12 ENCOUNTER — Other Ambulatory Visit: Payer: Self-pay

## 2021-04-12 ENCOUNTER — Ambulatory Visit: Payer: BC Managed Care – PPO | Admitting: Orthopaedic Surgery

## 2021-04-12 ENCOUNTER — Encounter: Payer: Self-pay | Admitting: Orthopaedic Surgery

## 2021-04-12 VITALS — Ht 70.0 in | Wt 185.0 lb

## 2021-04-12 DIAGNOSIS — M25512 Pain in left shoulder: Secondary | ICD-10-CM

## 2021-04-12 DIAGNOSIS — G8929 Other chronic pain: Secondary | ICD-10-CM | POA: Diagnosis not present

## 2021-04-12 NOTE — Progress Notes (Signed)
Patient: Penny Hensley           Date of Birth: 1961/03/02           MRN: [id number] Visit Date: 04/12/2021 PCP: [name], NP   Assessment & Plan:  Chief Complaint:  Chief Complaint  Patient presents with   Left Shoulder - Follow-up   Visit Diagnoses:  1. Chronic left shoulder pain     Plan: Eunice Blase returns today for follow-up of left shoulder adhesive capsulitis.  The intra-articular injection has helped greatly and she feels like she is 100% now.  She still does some home exercises.  It also resolved her left trapezius muscle pain.  She is still experiencing some on the right side.  In terms of the left shoulder her range of motion and strength and function are all back to normal.  She has some tenderness in the right trapezius.  I recommend that she sees to her PT for dry needling to the right trapezius.  I do recommend shoulder injection as there is no pathology there.  If she does not notice any improvement from the dry needling we can always consider cortisone injections in the right trapezius.  Otherwise we will see her back as needed.  Follow-Up Instructions: No follow-ups on file.   Orders:  No orders of the defined types were placed in this encounter.  No orders of the defined types were placed in this encounter.   Imaging: No results found.  PMFS History: Patient Active Problem List   Diagnosis Date Noted   Encounter to establish care 02/02/2021   Chronic left shoulder pain 02/02/2021   Vitamin D deficiency 02/02/2021   Gastroesophageal reflux disease without esophagitis 02/02/2021   Healthcare maintenance 02/02/2021   Routine cervical smear 08/18/2020   Atopic dermatitis 11/04/2019   Encounter for screening mammogram for malignant neoplasm of breast 11/04/2019   Encounter for hepatitis C screening test for low risk patient 11/04/2019   Bladder prolapse, female, acquired 04/14/2019   Spondylolisthesis of lumbar region 12/11/2018   Acquired  renal cyst of right kidney 11/02/2018   Edema leg 11/02/2018   Dysuria 11/02/2018   Numbness of right foot 05/20/2018   Subcutaneous nodule of toe of right foot 05/20/2018   Mild obesity 04/29/2018   Degenerative TFCC tear, left 04/15/2018   Left wrist tendinitis 04/15/2018   Mixed hyperlipidemia 01/13/2018   Cervical disc disease with myelopathy 01/13/2018   Primary insomnia 01/13/2018   Encounter for general adult medical examination with abnormal findings 01/13/2018   Chronic pain of left wrist 07/05/2017   Primary osteoarthritis of first carpometacarpal joint of left hand 05/17/2017   Chronic pain of left thumb 11/13/2016   Ds DNA antibody positive 10/30/2016   Arthralgia of multiple joints 10/30/2016   Past Medical History:  Diagnosis Date   Cholecystitis    Cholelithiasis    Hyperlipidemia     Family History  Problem Relation Age of Onset   Thyroid disease Mother    Hyperlipidemia Father    Heart disease Father    High Cholesterol Father    Thyroid disease Maternal Grandmother     Past Surgical History:  Procedure Laterality Date   CHOLECYSTECTOMY     Social History   Occupational History   Not on file  Tobacco Use   Smoking status: Never   Smokeless tobacco: Never  Substance and Sexual Activity   Alcohol use: Never    Comment: very rarely    Drug use: No  Sexual activity: Not Currently    Partners: Male

## 2021-08-01 ENCOUNTER — Encounter: Payer: 59 | Admitting: Physician Assistant

## 2021-08-05 ENCOUNTER — Other Ambulatory Visit: Payer: Self-pay

## 2021-08-05 ENCOUNTER — Encounter: Payer: Self-pay | Admitting: Nurse Practitioner

## 2021-08-05 ENCOUNTER — Ambulatory Visit (INDEPENDENT_AMBULATORY_CARE_PROVIDER_SITE_OTHER): Payer: BC Managed Care – PPO | Admitting: Nurse Practitioner

## 2021-08-05 VITALS — BP 109/71 | HR 64 | Temp 97.8°F | Ht 70.0 in | Wt 184.6 lb

## 2021-08-05 DIAGNOSIS — Z0001 Encounter for general adult medical examination with abnormal findings: Secondary | ICD-10-CM | POA: Diagnosis not present

## 2021-08-05 DIAGNOSIS — Z1231 Encounter for screening mammogram for malignant neoplasm of breast: Secondary | ICD-10-CM

## 2021-08-05 DIAGNOSIS — Z1211 Encounter for screening for malignant neoplasm of colon: Secondary | ICD-10-CM

## 2021-08-05 DIAGNOSIS — D582 Other hemoglobinopathies: Secondary | ICD-10-CM

## 2021-08-05 DIAGNOSIS — Z23 Encounter for immunization: Secondary | ICD-10-CM | POA: Diagnosis not present

## 2021-08-05 DIAGNOSIS — E782 Mixed hyperlipidemia: Secondary | ICD-10-CM

## 2021-08-05 NOTE — Progress Notes (Signed)
Established Patient Office Visit  Subjective:  Patient ID: Penny Hensley, female    DOB: 10-Apr-1961  Age: 60 y.o. MRN: [id number]  CC:  Chief Complaint  Patient presents with   Annual Exam    HPI Penny Hensley presents for annual wellness visit today.  Patient reports feeling well today.  She has no current concerns or complaints.  She denies chest pain, chest pressure, or shortness of breath. She denies headaches or visual disturbances. She denies abdominal pain, nausea, vomiting, or changes in bowel or bladder habits. She is due for colon cancer screening.  She had colonoscopy at age 86.  She has no family history of colon cancer.  She is due for screening mammogram. She would like to get flu shot while in the office today. Recent, routine fasting labs did show a mild elevation in calcium.  Would like to recheck calcium level along with intact PTH for further evaluation.  Past Medical History:  Diagnosis Date   Cholecystitis    Cholelithiasis    Hyperlipidemia     Past Surgical History:  Procedure Laterality Date   CHOLECYSTECTOMY      Family History  Problem Relation Age of Onset   Thyroid disease Mother    Hyperlipidemia Father    Heart disease Father    High Cholesterol Father    Thyroid disease Maternal Grandmother     Social History   Socioeconomic History   Marital status: Married    Spouse name: Not on file   Number of children: Not on file   Years of education: Not on file   Highest education level: Not on file  Occupational History   Not on file  Tobacco Use   Smoking status: Never   Smokeless tobacco: Never  Substance and Sexual Activity   Alcohol use: Never    Comment: very rarely    Drug use: No   Sexual activity: Not Currently    Partners: Male  Other Topics Concern   Not on file  Social History Narrative   Not on file   Social Determinants of Health   Financial Resource Strain: Not on file  Food Insecurity: Not on file   Transportation Needs: Not on file  Physical Activity: Not on file  Stress: Not on file  Social Connections: Not on file  Intimate Partner Violence: Not on file    Outpatient Medications Prior to Visit  Medication Sig Dispense Refill   calcium citrate (CALCITRATE - DOSED IN MG ELEMENTAL CALCIUM) 950 (200 Ca) MG tablet Take 1 tablet by mouth daily.     fluocinonide (LIDEX) 0.05 % external solution Apply 1 application topically 2 (two) times daily. 60 mL 2   furosemide (LASIX) 20 MG tablet Take 1 tablet (20 mg total) by mouth daily as needed. 90 tablet 1   ibuprofen (ADVIL) 800 MG tablet Take 1 tablet (800 mg total) by mouth every 8 (eight) hours as needed. 90 tablet 2   Multiple Vitamin (MULTI-VITAMINS) TABS Take by mouth.     omeprazole (PRILOSEC) 20 MG capsule Take 1 capsule (20 mg total) by mouth daily. 90 capsule 3   pravastatin (PRAVACHOL) 20 MG tablet Take 1 tablet (20 mg total) by mouth daily. 90 tablet 3   meloxicam (MOBIC) 15 MG tablet Take 1 tablet (15 mg total) by mouth daily. 30 tablet 1   No facility-administered medications prior to visit.    Allergies  Allergen Reactions   Naproxen Other (See Comments)    Mouth  Ulcers Mouth Ulcers  Mouth Ulcers    ROS Review of Systems  Constitutional:  Negative for activity change, appetite change, chills, fatigue and fever.  HENT:  Negative for congestion, postnasal drip, rhinorrhea, sinus pressure, sinus pain, sneezing and sore throat.   Eyes: Negative.   Respiratory:  Negative for cough, chest tightness, shortness of breath and wheezing.   Cardiovascular:  Negative for chest pain and palpitations.  Gastrointestinal:  Negative for abdominal pain, constipation, diarrhea, nausea and vomiting.  Endocrine: Negative for cold intolerance, heat intolerance, polydipsia and polyuria.  Genitourinary:  Negative for dyspareunia, dysuria, flank pain, frequency and urgency.  Musculoskeletal:  Negative for arthralgias, back pain and  myalgias.  Skin:  Negative for rash.  Allergic/Immunologic: Negative for environmental allergies.  Neurological:  Negative for dizziness, weakness and headaches.  Hematological:  Negative for adenopathy.  Psychiatric/Behavioral:  The patient is not nervous/anxious.      Objective:    Physical Exam Vitals and nursing note reviewed.  Constitutional:      Appearance: Normal appearance. She is well-developed.  HENT:     Head: Normocephalic and atraumatic.     Right Ear: Tympanic membrane, ear canal and external ear normal.     Left Ear: Tympanic membrane, ear canal and external ear normal.     Nose: Nose normal.     Mouth/Throat:     Mouth: Mucous membranes are moist.     Pharynx: Oropharynx is clear.  Eyes:     Extraocular Movements: Extraocular movements intact.     Conjunctiva/sclera: Conjunctivae normal.     Pupils: Pupils are equal, round, and reactive to light.  Neck:     Vascular: No carotid bruit.  Cardiovascular:     Rate and Rhythm: Normal rate and regular rhythm.     Pulses: Normal pulses.     Heart sounds: Normal heart sounds.  Pulmonary:     Effort: Pulmonary effort is normal.     Breath sounds: Normal breath sounds.  Chest:  Breasts:    Right: Normal. No swelling, bleeding, inverted nipple, mass, nipple discharge, skin change or tenderness.     Left: Normal. No swelling, bleeding, inverted nipple, mass, nipple discharge, skin change or tenderness.  Abdominal:     General: Bowel sounds are normal. There is no distension.     Palpations: Abdomen is soft. There is no mass.     Tenderness: There is no abdominal tenderness. There is no guarding or rebound.     Hernia: No hernia is present.  Musculoskeletal:        General: Normal range of motion.     Cervical back: Normal range of motion and neck supple.  Lymphadenopathy:     Cervical: No cervical adenopathy.     Upper Body:     Right upper body: No axillary adenopathy.     Left upper body: No axillary  adenopathy.  Skin:    General: Skin is warm and dry.     Capillary Refill: Capillary refill takes less than 2 seconds.  Neurological:     General: No focal deficit present.     Mental Status: She is alert and oriented to person, place, and time.  Psychiatric:        Mood and Affect: Mood normal.        Behavior: Behavior normal.        Thought Content: Thought content normal.        Judgment: Judgment normal.    Today's Vitals   08/05/21 0956  BP: 109/71  Pulse: 64  Temp: 97.8 F (36.6 C)  SpO2: 98%  Weight: 184 lb 9.6 oz (83.7 kg)  Height: '5\' 10"'  (1.778 m)   Body mass index is 26.49 kg/m.   Wt Readings from Last 3 Encounters:  08/05/21 184 lb 9.6 oz (83.7 kg)  04/12/21 185 lb (83.9 kg)  02/02/21 185 lb 9.6 oz (84.2 kg)     Health Maintenance Due  Topic Date Due   TETANUS/TDAP  Never done   Zoster Vaccines- Shingrix (1 of 2) Never done   COVID-19 Vaccine (4 - Booster for Pfizer series) 02/16/2021   COLONOSCOPY (Pts 45-22yr Insurance coverage will need to be confirmed)  03/21/2021    There are no preventive care reminders to display for this patient.  Lab Results  Component Value Date   TSH 2.200 02/02/2021   Lab Results  Component Value Date   WBC 5.2 08/05/2021   HGB 15.7 08/05/2021   HCT 46.8 (H) 08/05/2021   MCV 87 08/05/2021   PLT 269 08/05/2021   Lab Results  Component Value Date   NA 142 08/05/2021   K 4.6 08/05/2021   CO2 23 08/05/2021   GLUCOSE 96 08/05/2021   BUN 16 08/05/2021   CREATININE 0.90 08/05/2021   BILITOT 0.5 02/02/2021   ALKPHOS 97 02/02/2021   AST 30 02/02/2021   ALT 27 02/02/2021   PROT 7.8 02/02/2021   ALBUMIN 5.1 (H) 02/02/2021   CALCIUM 10.2 08/05/2021   EGFR 73 08/05/2021   Lab Results  Component Value Date   CHOL 238 (H) 02/02/2021   Lab Results  Component Value Date   HDL 53 02/02/2021   Lab Results  Component Value Date   LDLCALC 145 (H) 02/02/2021   Lab Results  Component Value Date   TRIG 224 (H)  02/02/2021   Lab Results  Component Value Date   CHOLHDL 4.5 (H) 02/02/2021   No results found for: HGBA1C    Assessment & Plan:  1. Encounter for general adult medical examination with abnormal findings Annual wellness visit today.  2. Hypercalcemia Reviewed recent labs with elevated calcium level.  Recheck BMP along with PTH and calcium.  Notify patient of results when they are available and follow-up as indicated. - Basic Metabolic Panel (BMET) - PTH, Intact and Calcium  3. Elevated hemoglobin (HCC) Recheck CBC today.  Notify patient of results when they are available and treat as indicated. - CBC  4. Mixed hyperlipidemia Currently stable.  Continue pravastatin as prescribed.  Continue low-fat, low-cholesterol diet.  Recheck in approximately 6 months.  5. Screening for colon cancer [facility]ologuard [facility] for [facility] cancer screening. - Cologuard  6. Encounter for screening mammogram for malignant neoplasm of breast Screening mammogram ordered today. - MM DIGITAL SCREENING BILATERAL; Future  7. Need for influenza vaccination Flu vaccine administered during today's visit. - Flu Vaccine QUAD 6+ mos PF IM (Fluarix Quad PF)   Problem List Items Addressed This Visit       Other   Mixed hyperlipidemia   Encounter for general adult medical examination with abnormal findings - Primary   Encounter for screening mammogram for malignant neoplasm of breast   Relevant Orders   MM DIGITAL SCREENING BILATERAL   Screening for colon cancer   Relevant Orders   Cologuard   Hypercalcemia   Relevant Orders   Basic Metabolic Panel (BMET) (Completed)   PTH, Intact and Calcium   Elevated hemoglobin (HCC)   Relevant Orders  CBC (Completed)   Need for influenza vaccination   Relevant Orders   Flu Vaccine QUAD 6+ mos PF IM (Fluarix Quad PF) (Completed)    Follow-up: No follow-ups on file.    Ronnell Freshwater, NP  This note was dictated using [employer]. Rapid proofreading was performed to expedite the delivery of the information. Despite proofreading, phonetic errors will occur which are common with this voice recognition software. Please take this into consideration. If there are any concerns, please contact our office.

## 2021-08-06 LAB — BASIC METABOLIC PANEL
BUN/Creatinine Ratio: 18 (ref 12–28)
BUN: 16 mg/dL (ref 8–27)
CO2: 23 mmol/L (ref 20–29)
Calcium: 10.2 mg/dL (ref 8.7–10.3)
Chloride: 101 mmol/L (ref 96–106)
Creatinine, Ser: 0.9 mg/dL (ref 0.57–1.00)
Glucose: 96 mg/dL (ref 70–99)
Potassium: 4.6 mmol/L (ref 3.5–5.2)
Sodium: 142 mmol/L (ref 134–144)
eGFR: 73 mL/min/{1.73_m2} (ref >59)

## 2021-08-06 LAB — CBC
Hematocrit: 46.8 % — ABNORMAL HIGH (ref 34.0–46.6)
Hemoglobin: 15.7 g/dL (ref 11.1–15.9)
MCH: 29 pg (ref 26.6–33.0)
MCHC: 33.5 g/dL (ref 31.5–35.7)
MCV: 87 fL (ref 79–97)
Platelets: 269 10*3/uL (ref 150–450)
RBC: 5.41 x10E6/uL — ABNORMAL HIGH (ref 3.77–5.28)
RDW: 13 % (ref 11.7–15.4)
WBC: 5.2 10*3/uL (ref 3.4–10.8)

## 2021-08-06 LAB — PTH, INTACT AND CALCIUM: PTH: 23 pg/mL (ref 15–65)

## 2021-08-07 DIAGNOSIS — D582 Other hemoglobinopathies: Secondary | ICD-10-CM | POA: Insufficient documentation

## 2021-08-07 DIAGNOSIS — Z23 Encounter for immunization: Secondary | ICD-10-CM | POA: Insufficient documentation

## 2021-08-08 ENCOUNTER — Encounter: Payer: Self-pay | Admitting: Nurse Practitioner

## 2021-08-08 NOTE — Progress Notes (Signed)
Labs improving.  Mild elevation of hematocrit still.  MyChart message sent to patient.

## 2021-08-15 ENCOUNTER — Encounter: Payer: Self-pay | Admitting: Nurse Practitioner

## 2021-08-16 ENCOUNTER — Other Ambulatory Visit: Payer: Self-pay | Admitting: Nurse Practitioner

## 2021-08-16 DIAGNOSIS — Z1231 Encounter for screening mammogram for malignant neoplasm of breast: Secondary | ICD-10-CM

## 2021-08-19 LAB — COLOGUARD: COLOGUARD: NEGATIVE

## 2021-08-22 NOTE — Progress Notes (Signed)
Negative cologuard. Repeat in three years.

## 2021-09-06 ENCOUNTER — Ambulatory Visit
Admission: RE | Admit: 2021-09-06 | Discharge: 2021-09-06 | Disposition: A | Discharge status: Home / Self Care | Payer: BC Managed Care – PPO | Source: Ambulatory Visit | Attending: Nurse Practitioner | Admitting: Nurse Practitioner

## 2021-09-06 ENCOUNTER — Other Ambulatory Visit: Payer: Self-pay

## 2021-09-06 DIAGNOSIS — Z1231 Encounter for screening mammogram for malignant neoplasm of breast: Secondary | ICD-10-CM | POA: Diagnosis present

## 2021-09-06 NOTE — Progress Notes (Signed)
Negative mammogram

## 2021-12-09 IMAGING — MG MM DIGITAL SCREENING BILAT W/ TOMO AND CAD
8 series · 8 of 24 positions shown · non-contrast
Comparison: Previous exam(s).

CLINICAL DATA: Screening.

EXAM:
DIGITAL SCREENING BILATERAL MAMMOGRAM WITH TOMOSYNTHESIS AND CAD
TECHNIQUE: Bilateral screening digital craniocaudal and mediolateral oblique
mammograms were obtained. Bilateral screening digital breast
tomosynthesis was performed. The images were evaluated with
computer-aided detection.

[L CC synth-2D]
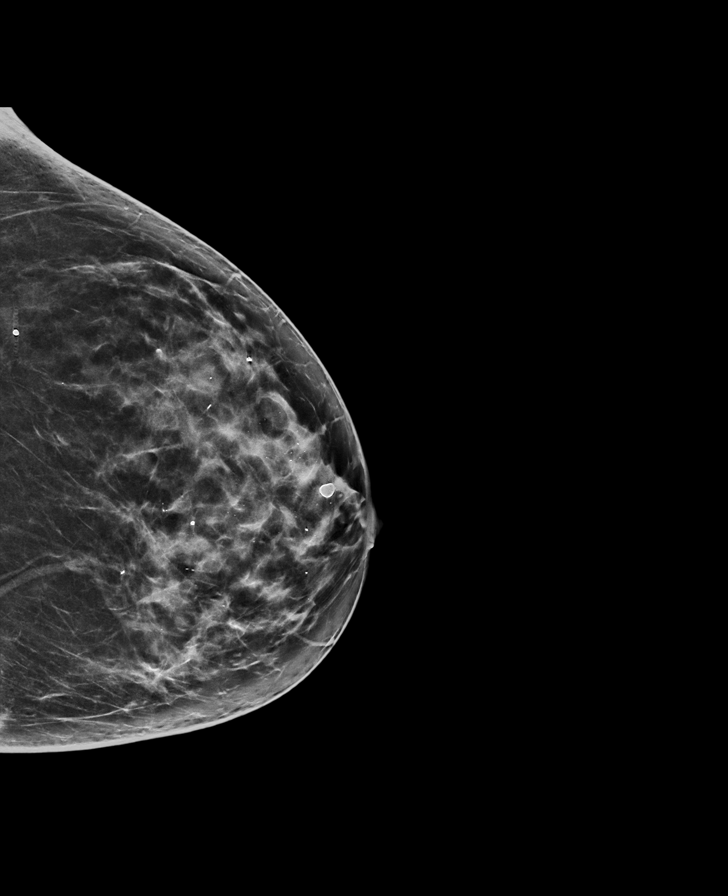

[L MLO synth-2D]
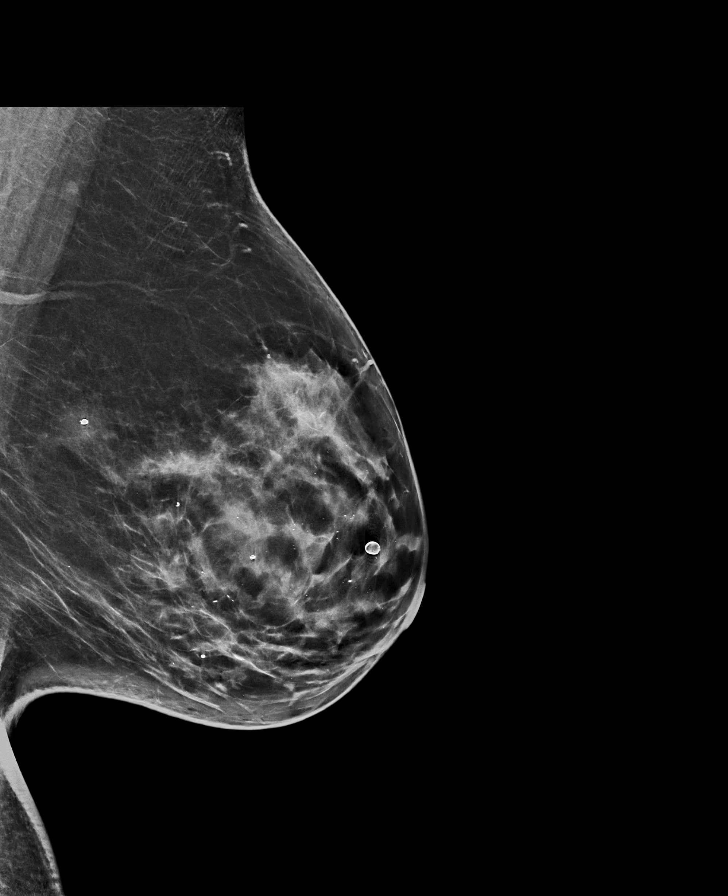

[R CC synth-2D]
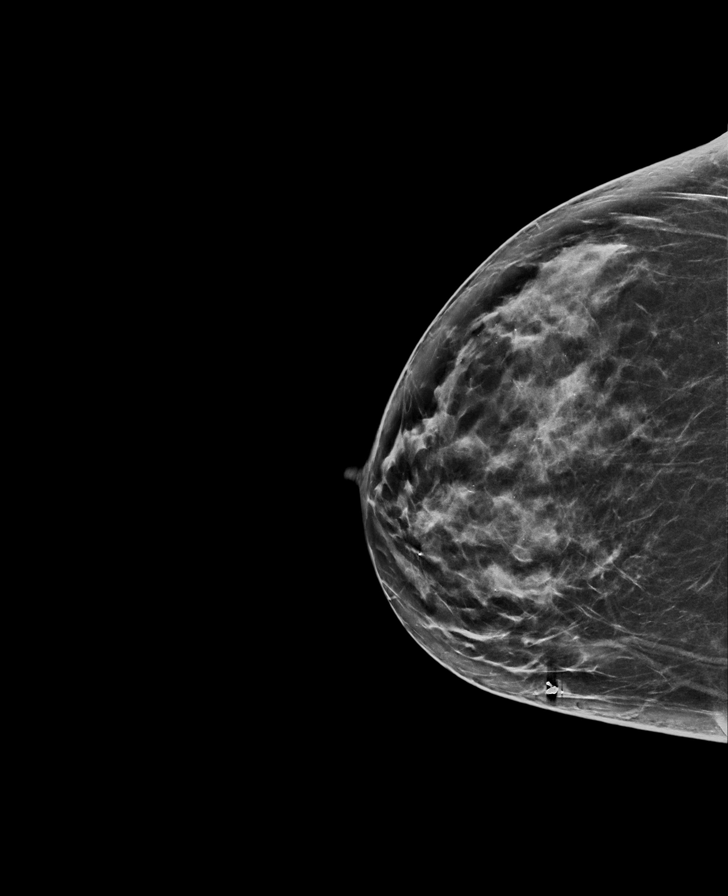

[R MLO synth-2D]
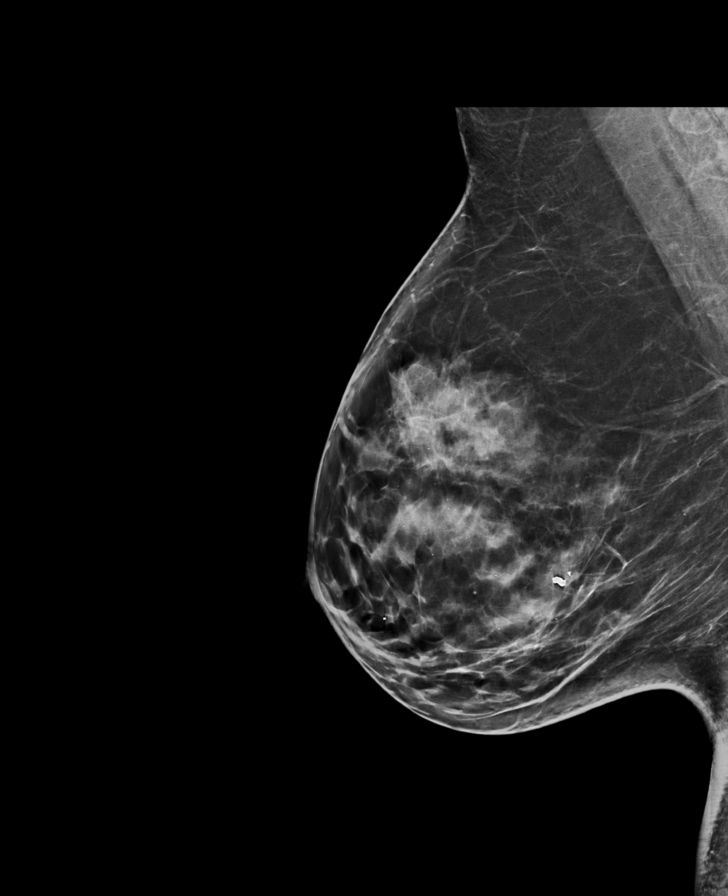

[L MLO tomo · tomo slice 40/79.0]
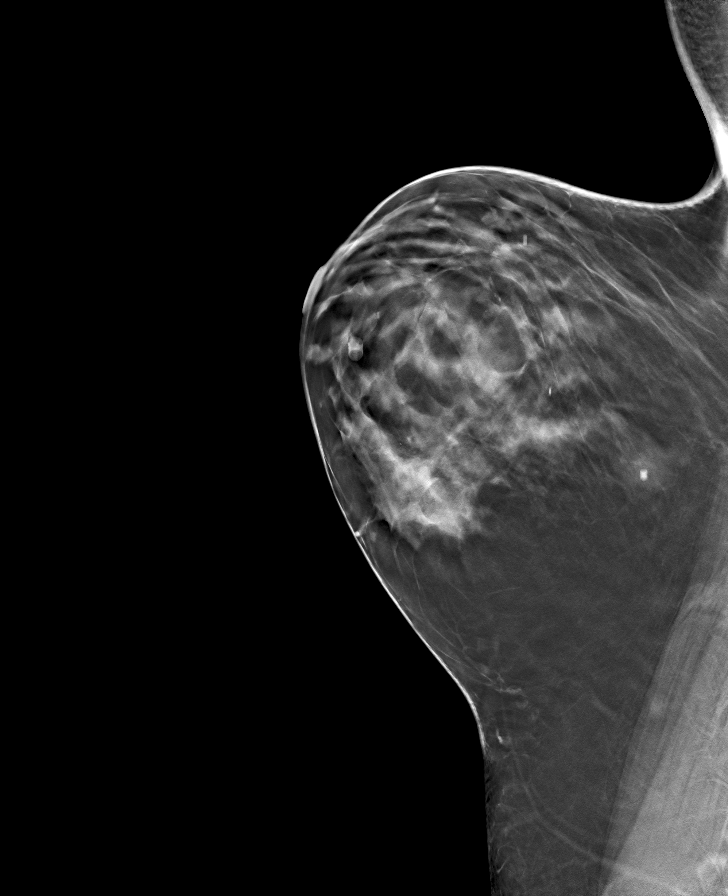

[R CC tomo · tomo slice 36/71.0]
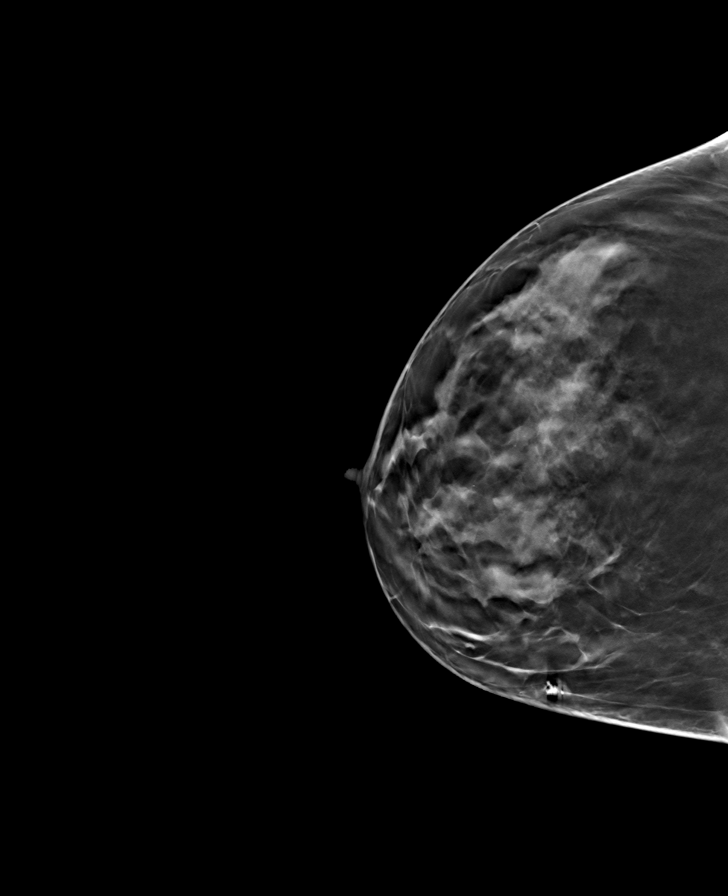

[L CC tomo · tomo slice 38/75.0]
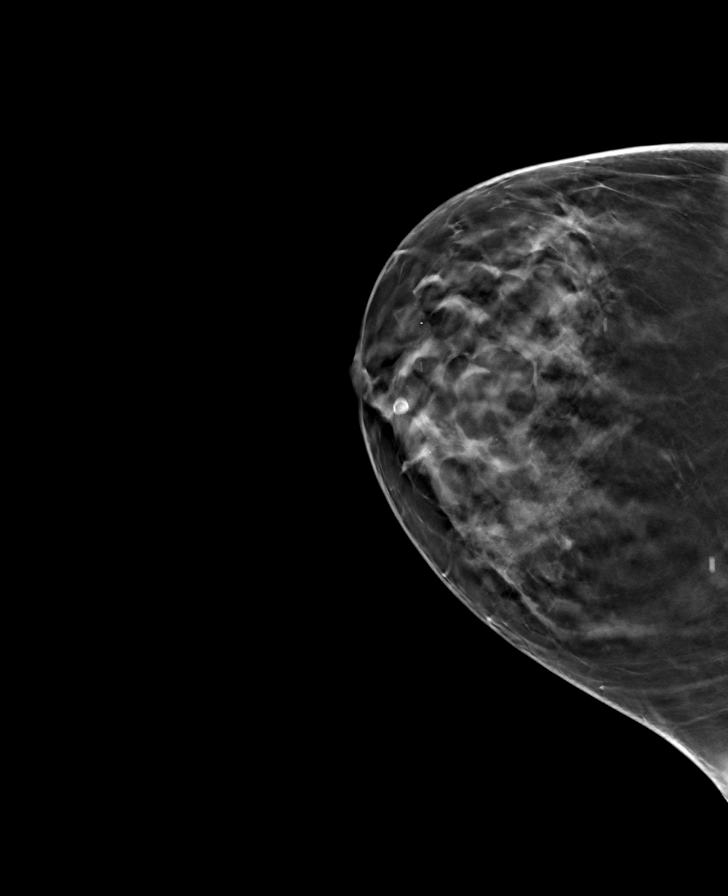

[R MLO tomo · tomo slice 41/80.0]
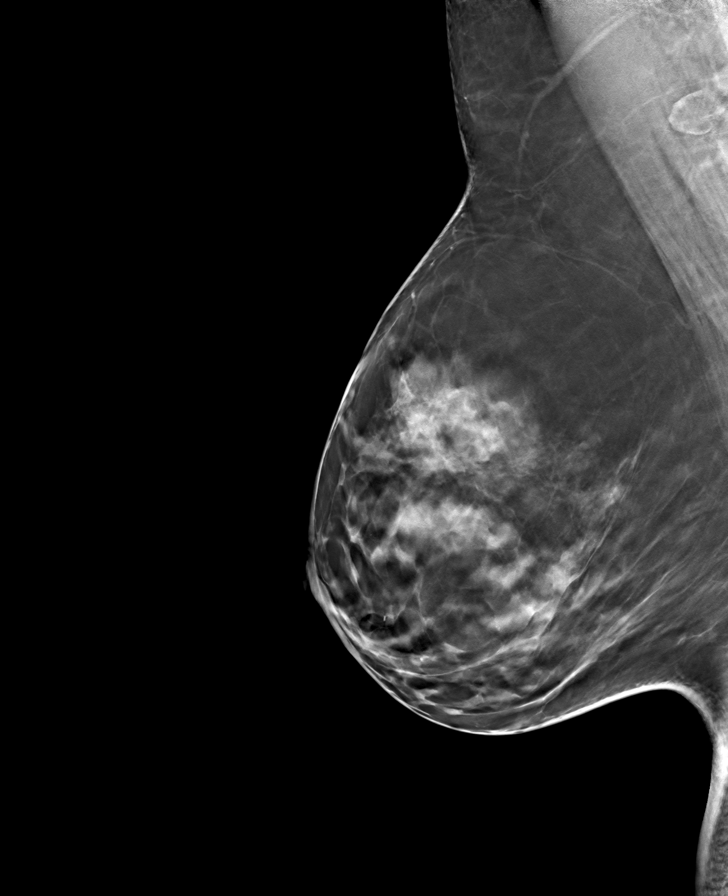

[8 of 24 positions shown; findings below may reference images not displayed]

ACR Breast Density Category c: The breast tissue is heterogeneously
dense, which may obscure small masses.
FINDINGS: There are no findings suspicious for malignancy.
IMPRESSION: No mammographic evidence of malignancy. A result letter of this
screening mammogram will be mailed directly to the patient.

RECOMMENDATION:
Screening mammogram in one year. (Code:Q3-W-BC3)

BI-RADS CATEGORY  1: Negative.

## 2022-02-05 NOTE — Progress Notes (Signed)
Established patient visit ? ? ?Patient: Penny Hensley   DOB: [birth year]   61 y.o. Female  MRN: [id number] ?Visit Date: 02/06/2022 ? ? ?Chief Complaint  ?Patient presents with  ? Follow-up  ? ?Subjective  ?  ?HPI  ?Routine follow up visit. ?-furosemide taken on as needed basis for swelling.  ?-[facility] - is being seen there for hormone replacement therapy. Was doing weight management, but her BMI is now too low to do medically managed programs for weight loss. She does have a goal of 180 or under.  ?--she is walking on her elliptical for 30 to 40 minutes, at least four times per week.  ?-she is due to have routine, fasting labs.  ?-no new concerns or complaints today  ? ? ?Medications: ?Outpatient Medications Prior to Visit  ?Medication Sig  ? calcium citrate (CALCITRATE - DOSED IN MG ELEMENTAL CALCIUM) 950 (200 Ca) MG tablet Take 1 tablet by mouth daily.  ? fluocinonide (LIDEX) 0.05 % external solution Apply 1 application topically 2 (two) times daily.  ? furosemide (LASIX) 20 MG tablet Take 1 tablet (20 mg total) by mouth daily as needed.  ? ibuprofen (ADVIL) 800 MG tablet Take 1 tablet (800 mg total) by mouth every 8 (eight) hours as needed.  ? Multiple Vitamin (MULTI-VITAMINS) TABS Take by mouth.  ? omeprazole (PRILOSEC) 20 MG capsule Take 1 capsule (20 mg total) by mouth daily.  ? pravastatin (PRAVACHOL) 20 MG tablet Take 1 tablet (20 mg total) by mouth daily.  ? ?No facility-administered medications prior to visit.  ? ? ?Review of Systems  ?Constitutional:  Negative for activity change, appetite change, chills, fatigue and fever.  ?     Four pound weight gain since her most recent visit with me.   ?HENT:  Negative for congestion, postnasal drip, rhinorrhea, sinus pressure, sinus pain, sneezing and sore throat.   ?Eyes: Negative.   ?Respiratory:  Negative for cough, chest tightness, shortness of breath and wheezing.   ?Cardiovascular:  Negative for chest pain and palpitations.   ?Gastrointestinal:  Negative for abdominal pain, constipation, diarrhea, nausea and vomiting.  ?Endocrine: Negative for cold intolerance, heat intolerance, polydipsia and polyuria.  ?Genitourinary:  Negative for dyspareunia, dysuria, flank pain, frequency and urgency.  ?Musculoskeletal:  Negative for arthralgias, back pain and myalgias.  ?Skin:  Negative for rash.  ?Allergic/Immunologic: Negative for environmental allergies.  ?Neurological:  Negative for dizziness, weakness and headaches.  ?Hematological:  Negative for adenopathy.  ?Psychiatric/Behavioral:  The patient is not nervous/anxious.   ? ? ? ? Objective  ?  ? ?Today's Vitals  ? 02/06/22 1018  ?BP: 109/69  ?Pulse: (!) 53  ?Temp: 97.9 ?F (36.6 ?C)  ?SpO2: 99%  ?Weight: 188 lb 4.8 oz (85.4 kg)  ?Height: 5' 10.08" (1.78 m)  ? ?Body mass index is 26.96 kg/m?.  ? ?BP Readings from Last 3 Encounters:  ?02/06/22 109/69  ?08/05/21 109/71  ?02/02/21 115/69  ?  ?Wt Readings from Last 3 Encounters:  ?02/06/22 188 lb 4.8 oz (85.4 kg)  ?08/05/21 184 lb 9.6 oz (83.7 kg)  ?04/12/21 185 lb (83.9 kg)  ?  ?Physical Exam ?Vitals and nursing note reviewed.  ?Constitutional:   ?   Appearance: Normal appearance. She is well-developed.  ?HENT:  ?   Head: Normocephalic and atraumatic.  ?   Nose: Nose normal.  ?   Mouth/Throat:  ?   Mouth: Mucous membranes are moist.  ?   Pharynx: Oropharynx is clear.  ?  Eyes:  ?   Extraocular Movements: Extraocular movements intact.  ?   Conjunctiva/sclera: Conjunctivae normal.  ?   Pupils: Pupils are equal, round, and reactive to light.  ?Cardiovascular:  ?   Rate and Rhythm: Normal rate and regular rhythm.  ?   Pulses: Normal pulses.  ?   Heart sounds: Normal heart sounds.  ?Pulmonary:  ?   Effort: Pulmonary effort is normal.  ?   Breath sounds: Normal breath sounds.  ?Abdominal:  ?   Palpations: Abdomen is soft.  ?Musculoskeletal:     ?   General: Normal range of motion.  ?   Cervical back: Normal range of motion and neck supple.   ?Lymphadenopathy:  ?   Cervical: No cervical adenopathy.  ?Skin: ?   General: Skin is warm and dry.  ?   Capillary Refill: Capillary refill takes less than 2 seconds.  ?Neurological:  ?   General: No focal deficit present.  ?   Mental Status: She is alert and oriented to person, place, and time.  ?Psychiatric:     ?   Mood and Affect: Mood normal.     ?   Behavior: Behavior normal.     ?   Thought Content: Thought content normal.     ?   Judgment: Judgment normal.  ?  ? ? Assessment & Plan  ?  ?1. Mixed hyperlipidemia ?Check fasting lipid panel today and adjust pravastatin dosing as indicated  ? ?2. Body mass index 26.0-26.9, adult ?Discussed lowering calorie intake to 1500 calories per day and incorporating exercise into daily routine to help lose weight.   ? ?3. Vitamin D deficiency ?Check vitamin d level today and treat deficiency as indicated  ?- Vitamin D (25 hydroxy); Future ?- Vitamin D (25 hydroxy) ? ?4. Screening for endocrine, nutritional, metabolic and immunity disorder ?Routine, fasting labs drawn during today's visit.  ?- Lipid panel; Future ?- TSH; Future ?- Hemoglobin A1c; Future ?- CBC with Differential/Platelet; Future ?- Comp Met (CMET); Future ?- Comp Met (CMET) ?- CBC with Differential/Platelet ?- Hemoglobin A1c ?- TSH ?- Lipid panel ? ?5. Need for shingles vaccine ?First shingles vaccine administered during today's visit  ?- Varicella-zoster vaccine IM ? ?6. Healthcare maintenance ?Routine, fasting labs drawn during today's visit.  ?- Lipid panel; Future ?- TSH; Future ?- Hemoglobin A1c; Future ?- CBC with Differential/Platelet; Future ?- Comp Met (CMET); Future ?- Comp Met (CMET) ?- CBC with Differential/Platelet ?- Hemoglobin A1c ?- TSH ?- Lipid panel  ? ? ?Problem List Items Addressed This Visit   ? ?  ? Other  ? Mixed hyperlipidemia - Primary  ? Vitamin D deficiency  ? Relevant Orders  ? Vitamin D (25 hydroxy)  ? Body mass index 26.0-26.9, adult  ? ?Other Visit Diagnoses   ? ? Screening for  endocrine, nutritional, metabolic and immunity disorder      ? Relevant Orders  ? Lipid panel  ? TSH  ? Hemoglobin A1c  ? CBC with Differential/Platelet  ? Comp Met (CMET)  ? Need for shingles vaccine      ? Relevant Orders  ? Varicella-zoster vaccine IM (Completed)  ? Healthcare maintenance      ? Relevant Orders  ? Lipid panel  ? TSH  ? Hemoglobin A1c  ? CBC with Differential/Platelet  ? Comp Met (CMET)  ? ?  ?  ? ?Return in about 6 months (around 08/08/2022) for health maintenance exam 2nd shingles shot.  ?   ? ? ? ? ?  Ronnell Freshwater, NP  ?[facility] ?[phone] (phone) ?[phone] (fax) ? ?[facility]  ?

## 2022-02-06 ENCOUNTER — Ambulatory Visit: Payer: BC Managed Care – PPO | Admitting: Nurse Practitioner

## 2022-02-06 ENCOUNTER — Encounter: Payer: Self-pay | Admitting: Nurse Practitioner

## 2022-02-06 VITALS — BP 109/69 | HR 53 | Temp 97.9°F | Ht 70.08 in | Wt 188.3 lb

## 2022-02-06 DIAGNOSIS — Z23 Encounter for immunization: Secondary | ICD-10-CM

## 2022-02-06 DIAGNOSIS — Z1329 Encounter for screening for other suspected endocrine disorder: Secondary | ICD-10-CM | POA: Diagnosis not present

## 2022-02-06 DIAGNOSIS — E559 Vitamin D deficiency, unspecified: Secondary | ICD-10-CM | POA: Diagnosis not present

## 2022-02-06 DIAGNOSIS — Z0000 Encounter for general adult medical examination without abnormal findings: Secondary | ICD-10-CM

## 2022-02-06 DIAGNOSIS — E782 Mixed hyperlipidemia: Secondary | ICD-10-CM

## 2022-02-06 DIAGNOSIS — Z1321 Encounter for screening for nutritional disorder: Secondary | ICD-10-CM

## 2022-02-06 DIAGNOSIS — Z6826 Body mass index (BMI) 26.0-26.9, adult: Secondary | ICD-10-CM

## 2022-02-06 DIAGNOSIS — Z6827 Body mass index (BMI) 27.0-27.9, adult: Secondary | ICD-10-CM | POA: Insufficient documentation

## 2022-02-06 DIAGNOSIS — Z130 Encounter for screening for diseases of the blood and blood-forming organs and certain disorders involving the immune mechanism: Secondary | ICD-10-CM

## 2022-02-06 DIAGNOSIS — Z13228 Encounter for screening for other metabolic disorders: Secondary | ICD-10-CM

## 2022-02-07 LAB — CBC WITH DIFFERENTIAL/PLATELET
Basophils Absolute: 0 10*3/uL (ref 0.0–0.2)
Basos: 1 %
EOS (ABSOLUTE): 0.1 10*3/uL (ref 0.0–0.4)
Eos: 1 %
Hematocrit: 47 % — ABNORMAL HIGH (ref 34.0–46.6)
Hemoglobin: 16.1 g/dL — ABNORMAL HIGH (ref 11.1–15.9)
Immature Grans (Abs): 0 10*3/uL (ref 0.0–0.1)
Immature Granulocytes: 0 %
Lymphocytes Absolute: 1.4 10*3/uL (ref 0.7–3.1)
Lymphs: 25 %
MCH: 29.2 pg (ref 26.6–33.0)
MCHC: 34.3 g/dL (ref 31.5–35.7)
MCV: 85 fL (ref 79–97)
Monocytes Absolute: 0.3 10*3/uL (ref 0.1–0.9)
Monocytes: 6 %
Neutrophils Absolute: 3.7 10*3/uL (ref 1.4–7.0)
Neutrophils: 67 %
Platelets: 266 10*3/uL (ref 150–450)
RBC: 5.52 x10E6/uL — ABNORMAL HIGH (ref 3.77–5.28)
RDW: 12.6 % (ref 11.7–15.4)
WBC: 5.5 10*3/uL (ref 3.4–10.8)

## 2022-02-07 LAB — COMPREHENSIVE METABOLIC PANEL
ALT: 18 IU/L (ref 0–32)
AST: 21 IU/L (ref 0–40)
Albumin/Globulin Ratio: 1.9 (ref 1.2–2.2)
Albumin: 4.7 g/dL (ref 3.8–4.8)
Alkaline Phosphatase: 85 IU/L (ref 44–121)
BUN/Creatinine Ratio: 15 (ref 12–28)
BUN: 12 mg/dL (ref 8–27)
Bilirubin Total: 0.6 mg/dL (ref 0.0–1.2)
CO2: 22 mmol/L (ref 20–29)
Calcium: 9.9 mg/dL (ref 8.7–10.3)
Chloride: 99 mmol/L (ref 96–106)
Creatinine, Ser: 0.79 mg/dL (ref 0.57–1.00)
Globulin, Total: 2.5 g/dL (ref 1.5–4.5)
Glucose: 96 mg/dL (ref 70–99)
Potassium: 4.7 mmol/L (ref 3.5–5.2)
Sodium: 137 mmol/L (ref 134–144)
Total Protein: 7.2 g/dL (ref 6.0–8.5)
eGFR: 85 mL/min/{1.73_m2} (ref >59)

## 2022-02-07 LAB — TSH: TSH: 1.71 u[IU]/mL (ref 0.450–4.500)

## 2022-02-07 LAB — LIPID PANEL
Chol/HDL Ratio: 4.2 ratio (ref 0.0–4.4)
Cholesterol, Total: 192 mg/dL (ref 100–199)
HDL: 46 mg/dL (ref >39)
LDL Chol Calc (NIH): 114 mg/dL — ABNORMAL HIGH (ref 0–99)
Triglycerides: 184 mg/dL — ABNORMAL HIGH (ref 0–149)
VLDL Cholesterol Cal: 32 mg/dL (ref 5–40)

## 2022-02-07 LAB — HEMOGLOBIN A1C
Est. average glucose Bld gHb Est-mCnc: 114 mg/dL
Hgb A1c MFr Bld: 5.6 % (ref 4.8–5.6)

## 2022-02-07 LAB — VITAMIN D 25 HYDROXY (VIT D DEFICIENCY, FRACTURES): Vit D, 25-Hydroxy: 41.1 ng/mL (ref 30.0–100.0)

## 2022-02-16 ENCOUNTER — Encounter: Payer: Self-pay | Admitting: Nurse Practitioner

## 2022-02-21 ENCOUNTER — Other Ambulatory Visit: Payer: Self-pay | Admitting: Nurse Practitioner

## 2022-02-21 DIAGNOSIS — E782 Mixed hyperlipidemia: Secondary | ICD-10-CM

## 2022-03-23 ENCOUNTER — Encounter: Payer: Self-pay | Admitting: Nurse Practitioner

## 2022-03-24 ENCOUNTER — Encounter: Payer: Self-pay | Admitting: Nurse Practitioner

## 2022-03-27 NOTE — Telephone Encounter (Signed)
This is fax number for [employer]. I signed her form and it is in my done box.

## 2022-03-27 NOTE — Telephone Encounter (Signed)
Faxed paperwork over

## 2022-04-01 ENCOUNTER — Other Ambulatory Visit: Payer: Self-pay | Admitting: Nurse Practitioner

## 2022-04-01 DIAGNOSIS — K219 Gastro-esophageal reflux disease without esophagitis: Secondary | ICD-10-CM

## 2022-08-07 NOTE — Progress Notes (Unsigned)
Complete physical exam   Patient: Penny Hensley   DOB: [birth year]   61 y.o. Female  MRN: [id number] Visit Date: 08/08/2022    No chief complaint on file.  Subjective    Penny Hensley is a 61 y.o. female who presents today for a complete physical exam.  She reports consuming a {diet types:17450} diet. {Exercise:19826} She generally feels {well/fairly well/poorly:18703}. She {does/does not:200015} have additional problems to discuss today.   HPI  Annual physical today.  -due for screenoing mammogram  08/2022.  -labs done 01/2022 -mild elevation of LDL and triglycerides  -mild elevation of Hgb and Hct.  -other labs essentially normal. -due for 2ng shingles vaccine and flu shot today     Past Medical History:  Diagnosis Date   Cholecystitis    Cholelithiasis    Hyperlipidemia    Past Surgical History:  Procedure Laterality Date   CHOLECYSTECTOMY     Social History   Socioeconomic History   Marital status: Married    Spouse name: Not on file   Number of children: Not on file   Years of education: Not on file   Highest education level: Not on file  Occupational History   Not on file  Tobacco Use   Smoking status: Never   Smokeless tobacco: Never  Substance and Sexual Activity   Alcohol use: Never    Comment: very rarely    Drug use: No   Sexual activity: Not Currently    Partners: Male  Other Topics Concern   Not on file  Social History Narrative   Not on file   Social Determinants of Health   Financial Resource Strain: Not on file  Food Insecurity: Not on file  Transportation Needs: Not on file  Physical Activity: Not on file  Stress: Not on file  Social Connections: Not on file  Intimate Partner Violence: Not on file   Family Status  Relation Name Status   Mother  (Not Specified)   Father  (Not Specified)   MGM  (Not Specified)   Family History  Problem Relation Age of Onset   Thyroid disease Mother    Hyperlipidemia Father    Heart  disease Father    High Cholesterol Father    Thyroid disease Maternal Grandmother    Allergies  Allergen Reactions   Naproxen Other (See Comments)    Mouth Ulcers Mouth Ulcers  Mouth Ulcers    Patient Care Team: Ronnell Freshwater, NP as PCP - General (Family Medicine)   Medications: Outpatient Medications Prior to Visit  Medication Sig   calcium citrate (CALCITRATE - DOSED IN MG ELEMENTAL CALCIUM) 950 (200 Ca) MG tablet Take 1 tablet by mouth daily.   fluocinonide (LIDEX) 0.05 % external solution Apply 1 application topically 2 (two) times daily.   furosemide (LASIX) 20 MG tablet Take 1 tablet (20 mg total) by mouth daily as needed.   ibuprofen (ADVIL) 800 MG tablet Take 1 tablet (800 mg total) by mouth every 8 (eight) hours as needed.   Multiple Vitamin (MULTI-VITAMINS) TABS Take by mouth.   omeprazole (PRILOSEC) 20 MG capsule TAKE 1 CAPSULE BY MOUTH EVERY DAY   pravastatin (PRAVACHOL) 20 MG tablet TAKE 1 TABLET BY MOUTH EVERY DAY   No facility-administered medications prior to visit.    Review of Systems  Last CBC Lab Results  Component Value Date   WBC 5.5 02/06/2022   HGB 16.1 (H) 02/06/2022   HCT 47.0 (H) 02/06/2022   MCV 85 02/06/2022  MCH 29.2 02/06/2022   RDW 12.6 02/06/2022   PLT 266 93/57/0177   Last metabolic panel Lab Results  Component Value Date   GLUCOSE 96 02/06/2022   NA 137 02/06/2022   K 4.7 02/06/2022   CL 99 02/06/2022   CO2 22 02/06/2022   BUN 12 02/06/2022   CREATININE 0.79 02/06/2022   EGFR 85 02/06/2022   CALCIUM 9.9 02/06/2022   PROT 7.2 02/06/2022   ALBUMIN 4.7 02/06/2022   LABGLOB 2.5 02/06/2022   AGRATIO 1.9 02/06/2022   BILITOT 0.6 02/06/2022   ALKPHOS 85 02/06/2022   AST 21 02/06/2022   ALT 18 02/06/2022   Last lipids Lab Results  Component Value Date   CHOL 192 02/06/2022   HDL 46 02/06/2022   LDLCALC 114 (H) 02/06/2022   TRIG 184 (H) 02/06/2022   CHOLHDL 4.2 02/06/2022   Last hemoglobin A1c Lab Results   Component Value Date   HGBA1C 5.6 02/06/2022   Last thyroid functions Lab Results  Component Value Date   TSH 1.710 02/06/2022   Last vitamin D Lab Results  Component Value Date   VD25OH 41.1 02/06/2022       Objective    There were no vitals filed for this visit. There is no height or weight on file to calculate BMI.  BP Readings from Last 3 Encounters:  02/06/22 109/69  08/05/21 109/71  02/02/21 115/69    Wt Readings from Last 3 Encounters:  02/06/22 188 lb 4.8 oz (85.4 kg)  08/05/21 184 lb 9.6 oz (83.7 kg)  04/12/21 185 lb (83.9 kg)     Physical Exam  ***  Last depression screening scores   Row Labels 02/06/2022   10:25 AM 08/05/2021    9:57 AM 02/02/2021   10:04 AM  PHQ 2/9 Scores   Section Header. No data exists in this row.     PHQ - 2 Score   0 0 0  PHQ- 9 Score   0 0 0   Last fall risk screening   Row Labels 08/05/2021    9:57 AM  Fall Risk    Section Header. No data exists in this row.   Falls in the past year?   0  Number falls in past yr:   0  Injury with Fall?   0  Follow up   Falls evaluation completed   Last Audit-C alcohol use screening   Row Labels 08/03/2020    9:46 AM  Alcohol Use Disorder Test (AUDIT)   Section Header. No data exists in this row.   1. How often do you have a drink containing alcohol?   1   A score of 3 or more in women, and 4 or more in men indicates increased risk for alcohol abuse, EXCEPT if all of the points are from question 1   No results found for any visits on 08/08/22.  Assessment & Plan    Routine Health Maintenance and Physical Exam  Exercise Activities and Dietary recommendations  Goals   None     Immunization History  Administered Date(s) Administered   Influenza, Quadrivalent, Recombinant, Inj, Pf 08/16/2019   Influenza,inj,Quad PF,6+ Mos 08/16/2019, 07/03/2020, 08/05/2021   PFIZER(Purple Top)SARS-COV-2 Vaccination 01/22/2020, 02/16/2020, 10/18/2020, 08/25/2021   Zoster Recombinat  (Shingrix) 02/06/2022    Health Maintenance  Topic Date Due   HIV Screening  Never done   TETANUS/TDAP  Never done   COVID-19 Vaccine (5 - Pfizer series) 10/20/2021   Zoster Vaccines- Shingrix (2 of 2) 04/03/2022  INFLUENZA VACCINE  05/23/2022   PAP SMEAR-Modifier  08/04/2023   MAMMOGRAM  09/07/2023   Fecal DNA (Cologuard)  08/15/2024   Hepatitis C Screening  Completed   HPV VACCINES  Aged Out   COLONOSCOPY (Pts 45-63yr Insurance coverage will need to be confirmed)  Discontinued    Discussed health benefits of physical activity, and encouraged her to engage in regular exercise appropriate for her age and condition.  Problem List Items Addressed This Visit   None    No follow-ups on file.        HRonnell Freshwater NP  [phone](phone) [phone](fax)  [place]

## 2022-08-08 ENCOUNTER — Ambulatory Visit (INDEPENDENT_AMBULATORY_CARE_PROVIDER_SITE_OTHER): Payer: BC Managed Care – PPO | Admitting: Nurse Practitioner

## 2022-08-08 ENCOUNTER — Encounter: Payer: Self-pay | Admitting: Nurse Practitioner

## 2022-08-08 VITALS — BP 117/72 | HR 81 | Ht 70.0 in | Wt 190.8 lb

## 2022-08-08 DIAGNOSIS — Z23 Encounter for immunization: Secondary | ICD-10-CM | POA: Diagnosis not present

## 2022-08-08 DIAGNOSIS — R600 Localized edema: Secondary | ICD-10-CM

## 2022-08-08 DIAGNOSIS — L209 Atopic dermatitis, unspecified: Secondary | ICD-10-CM | POA: Diagnosis not present

## 2022-08-08 DIAGNOSIS — E782 Mixed hyperlipidemia: Secondary | ICD-10-CM

## 2022-08-08 DIAGNOSIS — G4482 Headache associated with sexual activity: Secondary | ICD-10-CM | POA: Insufficient documentation

## 2022-08-08 DIAGNOSIS — Z6827 Body mass index (BMI) 27.0-27.9, adult: Secondary | ICD-10-CM

## 2022-08-08 DIAGNOSIS — Z0001 Encounter for general adult medical examination with abnormal findings: Secondary | ICD-10-CM | POA: Diagnosis not present

## 2022-08-08 DIAGNOSIS — Z1231 Encounter for screening mammogram for malignant neoplasm of breast: Secondary | ICD-10-CM

## 2022-08-08 MED ORDER — FLUOCINONIDE 0.05 % EX SOLN: 1.0000 | Freq: Two times a day (BID) | CUTANEOUS | 3 refills | Status: AC

## 2022-08-08 MED ORDER — PRAVASTATIN SODIUM 20 MG PO TABS: 20.0000 mg | ORAL_TABLET | Freq: Every day | ORAL | 5 refills | Status: DC

## 2022-08-08 MED ORDER — FUROSEMIDE 20 MG PO TABS: 20.0000 mg | ORAL_TABLET | Freq: Every day | ORAL | 3 refills | Status: DC | PRN

## 2022-08-16 ENCOUNTER — Other Ambulatory Visit: Payer: Self-pay | Admitting: Nurse Practitioner

## 2022-08-16 DIAGNOSIS — Z1231 Encounter for screening mammogram for malignant neoplasm of breast: Secondary | ICD-10-CM

## 2022-09-18 ENCOUNTER — Ambulatory Visit
Admission: RE | Admit: 2022-09-18 | Discharge: 2022-09-18 | Disposition: A | Discharge status: Home / Self Care | Payer: BC Managed Care – PPO | Source: Ambulatory Visit | Attending: Nurse Practitioner | Admitting: Nurse Practitioner

## 2022-09-18 DIAGNOSIS — Z1231 Encounter for screening mammogram for malignant neoplasm of breast: Secondary | ICD-10-CM | POA: Insufficient documentation

## 2022-09-19 ENCOUNTER — Encounter: Payer: Self-pay | Admitting: Nurse Practitioner

## 2022-09-19 NOTE — Progress Notes (Signed)
Negative mammogram - repeat in one year

## 2022-09-20 ENCOUNTER — Other Ambulatory Visit: Payer: Self-pay | Admitting: Nurse Practitioner

## 2022-09-20 DIAGNOSIS — B001 Herpesviral vesicular dermatitis: Secondary | ICD-10-CM

## 2022-09-20 MED ORDER — VALACYCLOVIR HCL 1 G PO TABS: 1000.0000 mg | ORAL_TABLET | Freq: Two times a day (BID) | ORAL | 1 refills | Status: DC

## 2023-02-06 NOTE — Progress Notes (Unsigned)
lEstablished patient visit   Patient: Penny Hensley   DOB: [birth year]   62 y.o. Female  MRN: [id number] Visit Date: 02/07/2023   Chief Complaint  Patient presents with   Medical Management of Chronic Issues   Subjective    HPI  Follow up  -hypertension  --generally well controlled  -hyperlipidemia  --Due to have routine, fasting labs today  -did have headache with sexual activity at last visit  --this has resolved  -She denies chest pain, chest pressure, or shortness of breath. she denies headaches or visual disturbances. She denies abdominal pain, nausea, vomiting, or changes in bowel or bladder habits.    Has seen dermatologist in February 2024 for full skin check. Will continue to go yearly.    Medications: Outpatient Medications Prior to Visit  Medication Sig   B Complex-C (SUPER B COMPLEX/VITAMIN C) TABS    Black Pepper-Turmeric (TURMERIC COMPLEX/BLACK PEPPER) 3-500 MG CAPS    calcium citrate (CALCITRATE - DOSED IN MG ELEMENTAL CALCIUM) 950 (200 Ca) MG tablet Take 1 tablet by mouth daily.   fluocinonide (LIDEX) 0.05 % external solution Apply 1 Application topically 2 (two) times daily.   furosemide (LASIX) 20 MG tablet Take 1 tablet (20 mg total) by mouth daily as needed.   ibuprofen (ADVIL) 800 MG tablet Take 1 tablet (800 mg total) by mouth every 8 (eight) hours as needed.   Magnesium 500 MG CAPS    Multiple Vitamin (MULTI-VITAMINS) TABS Take by mouth.   omeprazole (PRILOSEC) 20 MG capsule TAKE 1 CAPSULE BY MOUTH EVERY DAY   pravastatin (PRAVACHOL) 20 MG tablet Take 1 tablet (20 mg total) by mouth daily.   progesterone (PROMETRIUM) 100 MG capsule Take by mouth at bedtime.   [DISCONTINUED] valACYclovir (VALTREX) 1000 MG tablet Take 1 tablet (1,000 mg total) by mouth 2 (two) times daily.   No facility-administered medications prior to visit.    Review of Systems See HPI      Objective     Today's Vitals   02/07/23 0810  BP: 114/70  Pulse: 68  SpO2: 98%   Weight: 190 lb (86.2 kg)  Height:  (1.778 m)   Body mass index is 27.26 kg/m.  BP Readings from Last 3 Encounters:  02/07/23 114/70  08/08/22 117/72  02/06/22 109/69    Wt Readings from Last 3 Encounters:  02/07/23 190 lb (86.2 kg)  08/08/22 190 lb 12.8 oz (86.5 kg)  02/06/22 188 lb 4.8 oz (85.4 kg)    Physical Exam Vitals and nursing note reviewed.  Constitutional:      Appearance: Normal appearance. She is well-developed.  HENT:     Head: Normocephalic and atraumatic.     Nose: Nose normal.     Mouth/Throat:     Mouth: Mucous membranes are moist.     Pharynx: Oropharynx is clear.  Eyes:     Extraocular Movements: Extraocular movements intact.     Conjunctiva/sclera: Conjunctivae normal.     Pupils: Pupils are equal, round, and reactive to light.  Neck:     Vascular: No carotid bruit.  Cardiovascular:     Rate and Rhythm: Normal rate and regular rhythm.     Pulses: Normal pulses.     Heart sounds: Normal heart sounds.  Pulmonary:     Effort: Pulmonary effort is normal.     Breath sounds: Normal breath sounds.  Abdominal:     Palpations: Abdomen is soft.  Musculoskeletal:        General: Normal range of  motion.     Cervical back: Normal range of motion and neck supple.  Lymphadenopathy:     Cervical: No cervical adenopathy.  Skin:    General: Skin is warm and dry.     Capillary Refill: Capillary refill takes less than 2 seconds.  Neurological:     General: No focal deficit present.     Mental Status: She is alert and oriented to person, place, and time.  Psychiatric:        Mood and Affect: Mood normal.        Behavior: Behavior normal.        Thought Content: Thought content normal.        Judgment: Judgment normal.        Assessment & Plan    Mixed hyperlipidemia -     Lipid panel; Future -     Comprehensive metabolic panel; Future  Vitamin D deficiency Assessment & Plan: Check vitamin d level and treat deficiency as indicated.     Orders: -     VITAMIN D 25 Hydroxy (Vit-D Deficiency, Fractures); Future  Hypercalcemia Assessment & Plan: Check Ga level with routine labs today   Orders: -     Comprehensive metabolic panel; Future  Impaired fasting glucose Assessment & Plan: Check HgbA1c with routine, fasting labs today   Orders: -     Hemoglobin A1c; Future  Other fatigue Assessment & Plan: Check routine, fasting labs for further evaluation.   Orders: -     TSH + free T4; Future -     CBC; Future     Return in about 6 months (around 08/09/2023) for health maintenance exam, with pap.         [name], NP  [facility] [phone] (phone) [phone] (fax)  [facility]

## 2023-02-06 NOTE — Progress Notes (Incomplete)
Established patient visit   Patient: Penny Hensley   DOB: 13-Oct-1961   62 y.o. Female  MRN: [id number] Visit Date: 02/07/2023   No chief complaint on file.  Subjective    HPI  ***   Medications: Outpatient Medications Prior to Visit  Medication Sig  . calcium citrate (CALCITRATE - DOSED IN MG ELEMENTAL CALCIUM) 950 (200 Ca) MG tablet Take 1 tablet by mouth daily.  . fluocinonide (LIDEX) 0.05 % external solution Apply 1 Application topically 2 (two) times daily.  . furosemide (LASIX) 20 MG tablet Take 1 tablet (20 mg total) by mouth daily as needed.  Marland Kitchen ibuprofen (ADVIL) 800 MG tablet Take 1 tablet (800 mg total) by mouth every 8 (eight) hours as needed.  . Multiple Vitamin (MULTI-VITAMINS) TABS Take by mouth.  Marland Kitchen omeprazole (PRILOSEC) 20 MG capsule TAKE 1 CAPSULE BY MOUTH EVERY DAY  . pravastatin (PRAVACHOL) 20 MG tablet Take 1 tablet (20 mg total) by mouth daily.  . valACYclovir (VALTREX) 1000 MG tablet Take 1 tablet (1,000 mg total) by mouth 2 (two) times daily.   No facility-administered medications prior to visit.    Review of Systems  {Labs (Optional):23779}   Objective    There were no vitals filed for this visit. There is no height or weight on file to calculate BMI.  BP Readings from Last 3 Encounters:  08/08/22 117/72  02/06/22 109/69  08/05/21 109/71    Wt Readings from Last 3 Encounters:  08/08/22 190 lb 12.8 oz (86.5 kg)  02/06/22 188 lb 4.8 oz (85.4 kg)  08/05/21 184 lb 9.6 oz (83.7 kg)    Physical Exam  ***  No results found for any visits on 02/07/23.  Assessment & Plan    There are no diagnoses linked to this encounter.   No follow-ups on file.         [name], NP  [facility] at [facility] [phone] (phone) [phone] (fax)  [facility]

## 2023-02-07 ENCOUNTER — Encounter: Payer: Self-pay | Admitting: Nurse Practitioner

## 2023-02-07 ENCOUNTER — Ambulatory Visit: Payer: BC Managed Care – PPO | Admitting: Nurse Practitioner

## 2023-02-07 VITALS — BP 114/70 | HR 68 | Ht 70.0 in | Wt 190.0 lb

## 2023-02-07 DIAGNOSIS — E559 Vitamin D deficiency, unspecified: Secondary | ICD-10-CM

## 2023-02-07 DIAGNOSIS — E782 Mixed hyperlipidemia: Secondary | ICD-10-CM | POA: Diagnosis not present

## 2023-02-07 DIAGNOSIS — R7301 Impaired fasting glucose: Secondary | ICD-10-CM | POA: Insufficient documentation

## 2023-02-07 DIAGNOSIS — R5383 Other fatigue: Secondary | ICD-10-CM | POA: Insufficient documentation

## 2023-02-07 NOTE — Assessment & Plan Note (Signed)
Check routine, fasting labs for further evaluation.

## 2023-02-07 NOTE — Assessment & Plan Note (Signed)
Check vitamin d level and treat deficiency as indicated.   

## 2023-02-07 NOTE — Assessment & Plan Note (Signed)
Check Ga level with routine labs today

## 2023-02-07 NOTE — Assessment & Plan Note (Signed)
Check HgbA1c with routine, fasting labs today  

## 2023-02-08 LAB — CBC
Hematocrit: 46.9 % — ABNORMAL HIGH (ref 34.0–46.6)
Hemoglobin: 15.8 g/dL (ref 11.1–15.9)
MCH: 29.8 pg (ref 26.6–33.0)
MCHC: 33.7 g/dL (ref 31.5–35.7)
MCV: 88 fL (ref 79–97)
Platelets: 273 10*3/uL (ref 150–450)
RBC: 5.31 x10E6/uL — ABNORMAL HIGH (ref 3.77–5.28)
RDW: 13.1 % (ref 11.7–15.4)
WBC: 5.6 10*3/uL (ref 3.4–10.8)

## 2023-02-08 LAB — COMPREHENSIVE METABOLIC PANEL
ALT: 19 IU/L (ref 0–32)
AST: 23 IU/L (ref 0–40)
Albumin/Globulin Ratio: 1.9 (ref 1.2–2.2)
Albumin: 4.3 g/dL (ref 3.9–4.9)
Alkaline Phosphatase: 58 IU/L (ref 44–121)
BUN/Creatinine Ratio: 19 (ref 12–28)
BUN: 18 mg/dL (ref 8–27)
Bilirubin Total: 0.5 mg/dL (ref 0.0–1.2)
CO2: 24 mmol/L (ref 20–29)
Calcium: 9.4 mg/dL (ref 8.7–10.3)
Chloride: 102 mmol/L (ref 96–106)
Creatinine, Ser: 0.95 mg/dL (ref 0.57–1.00)
Globulin, Total: 2.3 g/dL (ref 1.5–4.5)
Glucose: 98 mg/dL (ref 70–99)
Potassium: 4.6 mmol/L (ref 3.5–5.2)
Sodium: 141 mmol/L (ref 134–144)
Total Protein: 6.6 g/dL (ref 6.0–8.5)
eGFR: 68 mL/min/{1.73_m2} (ref >59)

## 2023-02-08 LAB — LIPID PANEL
Chol/HDL Ratio: 4.7 ratio — ABNORMAL HIGH (ref 0.0–4.4)
Cholesterol, Total: 202 mg/dL — ABNORMAL HIGH (ref 100–199)
HDL: 43 mg/dL (ref >39)
LDL Chol Calc (NIH): 119 mg/dL — ABNORMAL HIGH (ref 0–99)
Triglycerides: 229 mg/dL — ABNORMAL HIGH (ref 0–149)
VLDL Cholesterol Cal: 40 mg/dL (ref 5–40)

## 2023-02-08 LAB — TSH+FREE T4
Free T4: 1.09 ng/dL (ref 0.82–1.77)
TSH: 2.04 u[IU]/mL (ref 0.450–4.500)

## 2023-02-08 LAB — HEMOGLOBIN A1C
Est. average glucose Bld gHb Est-mCnc: 111 mg/dL
Hgb A1c MFr Bld: 5.5 % (ref 4.8–5.6)

## 2023-02-08 LAB — VITAMIN D 25 HYDROXY (VIT D DEFICIENCY, FRACTURES): Vit D, 25-Hydroxy: 61.8 ng/mL (ref 30.0–100.0)

## 2023-07-09 ENCOUNTER — Ambulatory Visit: Payer: BC Managed Care – PPO | Admitting: Family

## 2023-08-14 ENCOUNTER — Ambulatory Visit: Payer: BC Managed Care – PPO | Admitting: Family

## 2023-09-01 ENCOUNTER — Other Ambulatory Visit: Payer: Self-pay | Admitting: Nurse Practitioner

## 2023-09-01 DIAGNOSIS — E782 Mixed hyperlipidemia: Secondary | ICD-10-CM

## 2023-09-03 ENCOUNTER — Encounter: Payer: Self-pay | Admitting: Podiatry

## 2023-09-03 ENCOUNTER — Ambulatory Visit: Payer: BC Managed Care – PPO | Admitting: Podiatry

## 2023-09-03 VITALS — BP 126/68 | HR 60

## 2023-09-03 DIAGNOSIS — B351 Tinea unguium: Secondary | ICD-10-CM

## 2023-09-03 MED ORDER — TERBINAFINE HCL 250 MG PO TABS: 250.0000 mg | ORAL_TABLET | Freq: Every day | ORAL | 0 refills | Status: AC

## 2023-09-03 NOTE — Patient Instructions (Signed)
VISIT SUMMARY:  Today, we discussed your concern about toenail fungus, which you noticed after removing your toenail polish. The affected nails include your left great toe, right great toe, and right third toe. You reported no pain or history of nail injury, but you do hike frequently in the summer.  YOUR PLAN:  -ONYCHOMYCOSIS: Onychomycosis is a fungal infection of the toenails, causing them to become discolored, thickened, and sometimes to lift off. We identified this condition in your left great toenail, right great toenail, and right third toenail. You will start taking oral terbinafine (Lamisil) once daily for 3 months. We will check your progress in 4 months. Please avoid pedicures during this time and use a separate nail clipper for the affected nails to prevent spreading the infection. Contact our office if you experience any side effects such as nausea, diarrhea, cramping, rash, hives, or itching.  INSTRUCTIONS:  We will check your progress in 4 months. Please contact our office if you experience any side effects such as nausea, diarrhea, cramping, rash, hives, or itching.

## 2023-09-03 NOTE — Progress Notes (Signed)
  Subjective:  Patient ID: Penny Hensley, female    DOB: [birth year],  MRN: [id number]  Chief Complaint  Patient presents with   Nail Problem    "I took my polish off and noticed I have a really bad toenail on my left big toe.  I noticed a spot on my third toe and I have split and a thickness on my right big toe."    Discussed the use of AI scribe software for clinical note transcription with the patient, who gave verbal consent to proceed.  History of Present Illness   The patient presents with a concern of toenail fungus. She first noticed the issue when she removed her toenail polish in the fall, observing that the nail on her left great toe had lifted off. She also noticed changes in her right great and third toes. She denies any history of nail injury but does hike frequently in the summer. The patient reports no associated pain.          Objective:    Physical Exam   EXTREMITIES: Feet warm and well perfused, good capillary fill time, palpable pulses. SKIN: Dystrophic onycholysis and yellow discoloration of the left great toenail. Onychomycosis of the distal right great toenail and distal right third toenail.            Results          Assessment:   1. Onychomycosis      Plan:  Patient was evaluated and treated and all questions answered.  Assessment and Plan    Onychomycosis   Dystrophic onycholysis and yellow discoloration of the left great toenail, along with onychomycosis of the distal right great toenail and distal right third toe, have been identified. She has no history of liver or kidney issues. After discussing the risks and benefits of oral, topical, and laser treatments, we will start oral terbinafine (Lamisil) once daily for 3 months. We will check progress in 4 months. She has been advised to avoid pedicures during treatment and to use a separate nail clipper for affected nails to prevent spread. She should contact the office if any side effects  such as nausea, diarrhea, cramping, rash, hives, or itching occur.          Return in about 4 months (around 01/01/2024) for follow up after nail fungus treatment.

## 2023-09-04 ENCOUNTER — Encounter: Payer: Self-pay | Admitting: Family

## 2023-09-04 ENCOUNTER — Other Ambulatory Visit: Payer: Self-pay | Admitting: Nurse Practitioner

## 2023-09-04 DIAGNOSIS — E782 Mixed hyperlipidemia: Secondary | ICD-10-CM

## 2023-09-04 MED ORDER — PRAVASTATIN SODIUM 20 MG PO TABS: 20.0000 mg | ORAL_TABLET | Freq: Every day | ORAL | 1 refills | Status: DC

## 2023-09-28 ENCOUNTER — Encounter: Payer: BC Managed Care – PPO | Admitting: Nurse Practitioner

## 2023-10-26 ENCOUNTER — Encounter: Payer: Self-pay | Admitting: Family

## 2023-10-26 ENCOUNTER — Ambulatory Visit: Payer: BC Managed Care – PPO | Admitting: Family

## 2023-10-26 VITALS — BP 138/84 | HR 92 | Temp 98.6°F | Ht 70.0 in | Wt 190.4 lb

## 2023-10-26 DIAGNOSIS — E782 Mixed hyperlipidemia: Secondary | ICD-10-CM

## 2023-10-26 DIAGNOSIS — K219 Gastro-esophageal reflux disease without esophagitis: Secondary | ICD-10-CM

## 2023-10-26 DIAGNOSIS — R300 Dysuria: Secondary | ICD-10-CM | POA: Insufficient documentation

## 2023-10-26 DIAGNOSIS — N8110 Cystocele, unspecified: Secondary | ICD-10-CM | POA: Diagnosis not present

## 2023-10-26 DIAGNOSIS — R768 Other specified abnormal immunological findings in serum: Secondary | ICD-10-CM | POA: Insufficient documentation

## 2023-10-26 DIAGNOSIS — R600 Localized edema: Secondary | ICD-10-CM | POA: Diagnosis not present

## 2023-10-26 DIAGNOSIS — N069 Isolated proteinuria with unspecified morphologic lesion: Secondary | ICD-10-CM

## 2023-10-26 DIAGNOSIS — R718 Other abnormality of red blood cells: Secondary | ICD-10-CM | POA: Diagnosis not present

## 2023-10-26 DIAGNOSIS — Z8349 Family history of other endocrine, nutritional and metabolic diseases: Secondary | ICD-10-CM | POA: Insufficient documentation

## 2023-10-26 DIAGNOSIS — Z79899 Other long term (current) drug therapy: Secondary | ICD-10-CM | POA: Insufficient documentation

## 2023-10-26 LAB — POC URINALSYSI DIPSTICK (AUTOMATED)
Bilirubin, UA: NEGATIVE
Blood, UA: NEGATIVE
Glucose, UA: NEGATIVE
Ketones, UA: POSITIVE — AB
Leukocytes, UA: NEGATIVE
Nitrite, UA: NEGATIVE
Protein, UA: POSITIVE — AB
Spec Grav, UA: 1.025 (ref 1.010–1.025)
Urobilinogen, UA: 0.2 U/dL
pH, UA: 5.5 (ref 5.0–8.0)

## 2023-10-26 LAB — CBC
HCT: 45.3 % (ref 36.0–46.0)
Hemoglobin: 15.4 g/dL — ABNORMAL HIGH (ref 12.0–15.0)
MCHC: 34 g/dL (ref 30.0–36.0)
MCV: 90.5 fL (ref 78.0–100.0)
Platelets: 274 10*3/uL (ref 150.0–400.0)
RBC: 5 Mil/uL (ref 3.87–5.11)
RDW: 13.3 % (ref 11.5–15.5)
WBC: 6.3 10*3/uL (ref 4.0–10.5)

## 2023-10-26 LAB — LIPID PANEL
Cholesterol: 218 mg/dL — ABNORMAL HIGH (ref 0–200)
HDL: 43.5 mg/dL (ref >39.00)
LDL Cholesterol: 127 mg/dL — ABNORMAL HIGH (ref 0–99)
NonHDL: 174.4
Total CHOL/HDL Ratio: 5
Triglycerides: 239 mg/dL — ABNORMAL HIGH (ref 0.0–149.0)
VLDL: 47.8 mg/dL — ABNORMAL HIGH (ref 0.0–40.0)

## 2023-10-26 LAB — TSH: TSH: 2.51 u[IU]/mL (ref 0.35–5.50)

## 2023-10-26 LAB — VITAMIN B12: Vitamin B-12: 374 pg/mL (ref 211–911)

## 2023-10-26 MED ORDER — OMEPRAZOLE 20 MG PO CPDR: 20.0000 mg | DELAYED_RELEASE_CAPSULE | Freq: Every day | ORAL | 0 refills | Status: AC

## 2023-10-26 NOTE — Progress Notes (Signed)
 Established Patient Office Visit  Subjective:  Patient ID: Penny Hensley, female    DOB: [birth year]  Age: 63 y.o. MRN: [id number]  CC:  Chief Complaint  Patient presents with   Establish Care    HPI Penny Hensley is here for a new patient visit to establish care.   Prior provider was: powell Lessen, NP    Pt is with acute concerns.  Dysuria within the last one month.    Toenail fungus left foot, seeing podiatry, has since started on terbinafine  250 mg once daily for three months. Repeat f/u in about that time frame.   Pedal edema: furosemide  20 mg once daily prn when she is on her feet a lot they swell.   HLD: pravastatin  20 mg nightly tolerates well.   Polyarthralgia: rarely uses ibuprofen  but will take it as needed.   HRT: followed by [facility], they started her on progesterone , estrace  and also testosterone pellets. She is taking spironolactone to negate the testosterone effects of hirsutism. She is thinking that she might want to wean down off of this over time. Recent testosterone from labs that she brought with her are at 197.   GERD: on omeprazole  10 mg once daily and doing well with this. She will only take the pill about once a week. If she eats dinner late and goes right to bed she will have heart burn but if not within that criteria she does well.   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 Had joint injection, was supposed to f/u in six months back in 2020 but did not because she had relief of the pain. Still with some joint stiffness specifically the neck area and left hand.   Last metabolic panel Lab Results  Component Value Date   GLUCOSE 98 02/07/2023   NA 141 02/07/2023   K 4.6 02/07/2023   CL 102 02/07/2023   CO2 24 02/07/2023   BUN 18 02/07/2023   CREATININE 0.95 02/07/2023   EGFR 68 02/07/2023   CALCIUM 9.4 02/07/2023   PROT 6.6 02/07/2023   ALBUMIN 4.3  02/07/2023   LABGLOB 2.3 02/07/2023   AGRATIO 1.9 02/07/2023   BILITOT 0.5 02/07/2023   ALKPHOS 58 02/07/2023   AST 23 02/07/2023   ALT 19 02/07/2023    Lab Results  Component Value Date   HGBA1C 5.5 02/07/2023   Lab Results  Component Value Date   WBC 5.6 02/07/2023   HGB 15.8 02/07/2023   HCT 46.9 (H) 02/07/2023   MCV 88 02/07/2023   PLT 273 02/07/2023    chronic concerns:      Past Medical History:  Diagnosis Date   Cholecystitis    Cholelithiasis    Hyperlipidemia     Past Surgical History:  Procedure Laterality Date   CHOLECYSTECTOMY      Family History  Problem Relation Age of Onset   Thyroid  disease Mother    Hyperlipidemia Father    Heart disease Father    High Cholesterol Father    Thyroid  disease Maternal Grandmother     Social History   Socioeconomic History   Marital status: Married    Spouse name: Not on file   Number of children: 1   Years of education: Not on file   Highest education level: Bachelor's degree (e.g., BA, AB, BS)  Occupational History   Not on file  Tobacco Use   Smoking status: Never  Smokeless tobacco: Never  Vaping Use   Vaping status: Never Used  Substance and Sexual Activity   Alcohol use: Never    Comment: very rarely    Drug use: No   Sexual activity: Yes    Partners: Male    Birth control/protection: Implant  Other Topics Concern   Not on file  Social History Narrative   One boy age 69   Social Drivers of Health   Financial Resource Strain: Low Risk  (10/25/2023)   Overall Financial Resource Strain (CARDIA)    Difficulty of Paying Living Expenses: Not hard at all  Food Insecurity: No Food Insecurity (10/25/2023)   Hunger Vital Sign    Worried About Running Out of Food in the Last Year: Never true    Ran Out of Food in the Last Year: Never true  Transportation Needs: No Transportation Needs (10/25/2023)   PRAPARE - Administrator, Civil Service (Medical): No    Lack of Transportation  (Non-Medical): No  Physical Activity: Insufficiently Active (10/25/2023)   Exercise Vital Sign    Days of Exercise per Week: 2 days    Minutes of Exercise per Session: 30 min  Stress: No Stress Concern Present (10/25/2023)   [employer] of Occupational Health - Occupational Stress Questionnaire    Feeling of Stress : Not at all  Social Connections: Unknown (10/25/2023)   Social Connection and Isolation Panel [NHANES]    Frequency of Communication with Friends and Family: Not on file    Frequency of Social Gatherings with Friends and Family: Not on file    Attends Religious Services: Not on file    Active Member of Clubs or Organizations: No    Attends Banker Meetings: Not on file    Marital Status: Married  Catering Manager Violence: Not on file    Outpatient Medications Prior to Visit  Medication Sig Dispense Refill   B Complex-C (SUPER B COMPLEX/VITAMIN C) TABS      Black Pepper-Turmeric (TURMERIC COMPLEX/BLACK PEPPER) 3-500 MG CAPS      calcium citrate (CALCITRATE - DOSED IN MG ELEMENTAL CALCIUM) 950 (200 Ca) MG tablet Take 1 tablet by mouth daily.     estradiol  (ESTRACE ) 0.1 MG/GM vaginal cream Place 1 Applicatorful vaginally at bedtime.     fluocinonide  (LIDEX ) 0.05 % external solution Apply 1 Application topically 2 (two) times daily. 180 mL 3   furosemide  (LASIX ) 20 MG tablet Take 1 tablet (20 mg total) by mouth daily as needed. 90 tablet 3   ibuprofen  (ADVIL ) 800 MG tablet Take 1 tablet (800 mg total) by mouth every 8 (eight) hours as needed. 90 tablet 2   Magnesium 500 MG CAPS      Multiple Vitamin (MULTI-VITAMINS) TABS Take by mouth.     pravastatin  (PRAVACHOL ) 20 MG tablet Take 1 tablet (20 mg total) by mouth daily. 90 tablet 1   progesterone  (PROMETRIUM ) 100 MG capsule Take by mouth at bedtime.     spironolactone (ALDACTONE) 25 MG tablet Take 50 mg by mouth daily.     terbinafine  (LAMISIL ) 250 MG tablet Take 1 tablet (250 mg total) by mouth daily. 90 tablet  0   omeprazole  (PRILOSEC) 20 MG capsule TAKE 1 CAPSULE BY MOUTH EVERY DAY 30 capsule 11   No facility-administered medications prior to visit.    Allergies  Allergen Reactions   Naproxen Other (See Comments)    Mouth Ulcers Mouth Ulcers  Mouth Ulcers    ROS Review of Systems  Review of Systems  Respiratory:  Negative for shortness of breath.   Cardiovascular:  Negative for chest pain and palpitations.  Gastrointestinal:  Negative for constipation and diarrhea.  Genitourinary:  Negative for dysuria, frequency and urgency.  Musculoskeletal:  Negative for myalgias.  Psychiatric/Behavioral:  Negative for depression and suicidal ideas.   All other systems reviewed and are negative.    Objective:    Physical Exam Vitals reviewed.  Constitutional:      General: She is not in acute distress.    Appearance: Normal appearance. She is not ill-appearing or toxic-appearing.  HENT:     Right Ear: Tympanic membrane normal.     Left Ear: Tympanic membrane normal.     Mouth/Throat:     Mouth: Mucous membranes are moist.     Pharynx: No pharyngeal swelling.     Tonsils: No tonsillar exudate.  Eyes:     Extraocular Movements: Extraocular movements intact.     Conjunctiva/sclera: Conjunctivae normal.     Pupils: Pupils are equal, round, and reactive to light.  Neck:     Thyroid : No thyroid  mass.  Cardiovascular:     Rate and Rhythm: Normal rate and regular rhythm.  Pulmonary:     Effort: Pulmonary effort is normal.     Breath sounds: Normal breath sounds.  Abdominal:     General: Abdomen is flat. Bowel sounds are normal.     Palpations: Abdomen is soft.  Musculoskeletal:        General: Normal range of motion.  Lymphadenopathy:     Cervical:     Right cervical: No superficial cervical adenopathy.    Left cervical: No superficial cervical adenopathy.  Skin:    General: Skin is warm.     Capillary Refill: Capillary refill takes less than 2 seconds.  Neurological:      General: No focal deficit present.     Mental Status: She is alert and oriented to person, place, and time.  Psychiatric:        Mood and Affect: Mood normal.        Behavior: Behavior normal.        Thought Content: Thought content normal.        Judgment: Judgment normal.       BP 138/84 (BP Location: Left Arm, Patient Position: Sitting, Cuff Size: Normal)   Pulse 92   Temp 98.6 F (37 C) (Temporal)   Ht 5' 10 (1.778 m)   Wt 190 lb 6.4 oz (86.4 kg)   SpO2 98%   BMI 27.32 kg/m  Wt Readings from Last 3 Encounters:  10/26/23 190 lb 6.4 oz (86.4 kg)  02/07/23 190 lb (86.2 kg)  08/08/22 190 lb 12.8 oz (86.5 kg)     Health Maintenance Due  Topic Date Due   HIV Screening  Never done   DTaP/Tdap/Td (1 - Tdap) Never done    There are no preventive care reminders to display for this patient.  Lab Results  Component Value Date   TSH 2.040 02/07/2023   Lab Results  Component Value Date   WBC 5.6 02/07/2023   HGB 15.8 02/07/2023   HCT 46.9 (H) 02/07/2023   MCV 88 02/07/2023   PLT 273 02/07/2023   Lab Results  Component Value Date   NA 141 02/07/2023   K 4.6 02/07/2023   CO2 24 02/07/2023   GLUCOSE 98 02/07/2023   BUN 18 02/07/2023   CREATININE 0.95 02/07/2023   BILITOT 0.5 02/07/2023   ALKPHOS 58 02/07/2023  AST 23 02/07/2023   ALT 19 02/07/2023   PROT 6.6 02/07/2023   ALBUMIN 4.3 02/07/2023   CALCIUM 9.4 02/07/2023   EGFR 68 02/07/2023   Lab Results  Component Value Date   CHOL 202 (H) 02/07/2023   Lab Results  Component Value Date   HDL 43 02/07/2023   Lab Results  Component Value Date   LDLCALC 119 (H) 02/07/2023   Lab Results  Component Value Date   TRIG 229 (H) 02/07/2023   Lab Results  Component Value Date   CHOLHDL 4.7 (H) 02/07/2023   Lab Results  Component Value Date   HGBA1C 5.5 02/07/2023      Assessment & Plan:   Long-term current use of proton pump inhibitor therapy  Gastroesophageal reflux disease without  esophagitis Assessment & Plan: Omeprazole  20 mg once daily x 2 weeks to see if we can have improvement and then we will discuss weaning off, educated on long term effects possibly of PPI use  Orders: -     Omeprazole ; Take 1 capsule (20 mg total) by mouth daily.  Dispense: 30 capsule; Refill: 0  Pedal edema Assessment & Plan: Furosemide  prn    Bladder prolapse, female, acquired  Dysuria Assessment & Plan: Urine dip today with ketones and protein, no leuks Will send for culture to r/o UTI however then might suspect from dysuria vaginal dryness/vaginal atrophy    Orders: -     POCT Urinalysis Dipstick (Automated) -     Urinalysis w microscopic + reflex cultur; Future  Elevated hematocrit -     Vitamin B12; Future -     CBC  Mixed hyperlipidemia Assessment & Plan: Ordered lipid panel, pending results. Work on low cholesterol diet and exercise as tolerated Continue pravastatin  20 mg nightly  Orders: -     Lipid panel  ANA positive -     Rheumatoid factor -     ANA Screen,IFA,Reflex Titer/Pattern,Reflex Mplx 11 Ab Cascade with IdentRA  Ds DNA antibody positive Assessment & Plan: Repeat ana today pending results   Orders: -     Rheumatoid factor -     ANA Screen,IFA,Reflex Titer/Pattern,Reflex Mplx 11 Ab Cascade with IdentRA  Family history of thyroid  disease in mother -     TSH  Isolated proteinuria with morphologic lesion -     Urinalysis w microscopic + reflex cultur; Future    Meds ordered this encounter  Medications   omeprazole  (PRILOSEC) 20 MG capsule    Sig: Take 1 capsule (20 mg total) by mouth daily.    Dispense:  30 capsule    Refill:  0    Follow-up: Return for schedule f/u CPe/PAP when pt would like.    Ginger Patrick, FNP

## 2023-10-26 NOTE — Assessment & Plan Note (Signed)
 Omeprazole 20 mg once daily x 2 weeks to see if we can have improvement and then we will discuss weaning off, educated on long term effects possibly of PPI use

## 2023-10-26 NOTE — Assessment & Plan Note (Signed)
 Urine dip today with ketones and protein, no leuks Will send for culture to r/o UTI however then might suspect from dysuria vaginal dryness/vaginal atrophy

## 2023-10-26 NOTE — Assessment & Plan Note (Signed)
 Ordered lipid panel, pending results. Work on low cholesterol diet and exercise as tolerated Continue pravastatin 20 mg nightly

## 2023-10-26 NOTE — Assessment & Plan Note (Signed)
Furosemide prn 

## 2023-10-26 NOTE — Assessment & Plan Note (Signed)
 Repeat ana today pending results

## 2023-10-27 LAB — URINALYSIS W MICROSCOPIC + REFLEX CULTURE
Bacteria, UA: NONE SEEN /[HPF]
Bilirubin Urine: NEGATIVE
Glucose, UA: NEGATIVE
Hgb urine dipstick: NEGATIVE
Hyaline Cast: NONE SEEN /[LPF]
Ketones, ur: NEGATIVE
Leukocyte Esterase: NEGATIVE
Nitrites, Initial: NEGATIVE
Protein, ur: NEGATIVE
RBC / HPF: NONE SEEN /[HPF] (ref 0–2)
Specific Gravity, Urine: 1.034 (ref 1.001–1.035)
WBC, UA: NONE SEEN /[HPF] (ref 0–5)
pH: 5.5 (ref 5.0–8.0)

## 2023-10-27 LAB — NO CULTURE INDICATED

## 2023-10-30 LAB — ANA SCREEN,IFA,REFLEX TITER/PATTERN,REFLEX MPLX 11 AB CASCADE
Anti Nuclear Antibody (ANA): NEGATIVE
Cyclic Citrullin Peptide Ab: <16 U
MUTATED CITRULLINATED VIMENTIN (MCV) AB: <20 U/mL (ref <20)
Rheumatoid fact SerPl-aCnc: <10 [IU]/mL (ref <14)

## 2023-11-27 ENCOUNTER — Encounter: Payer: BC Managed Care – PPO | Admitting: Family

## 2023-11-28 ENCOUNTER — Ambulatory Visit: Payer: BC Managed Care – PPO | Admitting: Family

## 2023-11-28 ENCOUNTER — Encounter: Payer: Self-pay | Admitting: Family

## 2023-11-28 ENCOUNTER — Other Ambulatory Visit (HOSPITAL_COMMUNITY)
Admission: RE | Admit: 2023-11-28 | Discharge: 2023-11-28 | Disposition: A | Discharge status: Home / Self Care | Payer: BC Managed Care – PPO | Source: Ambulatory Visit | Attending: Family | Admitting: Family

## 2023-11-28 VITALS — BP 122/78 | HR 70 | Temp 98.6°F | Ht 70.0 in | Wt 190.0 lb

## 2023-11-28 DIAGNOSIS — Z1272 Encounter for screening for malignant neoplasm of vagina: Secondary | ICD-10-CM | POA: Diagnosis present

## 2023-11-28 DIAGNOSIS — Z0000 Encounter for general adult medical examination without abnormal findings: Secondary | ICD-10-CM | POA: Diagnosis not present

## 2023-11-28 DIAGNOSIS — Z01419 Encounter for gynecological examination (general) (routine) without abnormal findings: Secondary | ICD-10-CM | POA: Diagnosis present

## 2023-11-28 DIAGNOSIS — Z124 Encounter for screening for malignant neoplasm of cervix: Secondary | ICD-10-CM | POA: Insufficient documentation

## 2023-11-28 DIAGNOSIS — Z1231 Encounter for screening mammogram for malignant neoplasm of breast: Secondary | ICD-10-CM | POA: Diagnosis not present

## 2023-11-28 DIAGNOSIS — N952 Postmenopausal atrophic vaginitis: Secondary | ICD-10-CM

## 2023-11-28 DIAGNOSIS — E782 Mixed hyperlipidemia: Secondary | ICD-10-CM

## 2023-11-28 MED ORDER — ESTRADIOL 0.1 MG/GM VA CREA: 1.0000 | TOPICAL_CREAM | VAGINAL | 2 refills | Status: AC

## 2023-11-28 MED ORDER — PROGESTERONE MICRONIZED 100 MG PO CAPS: 100.0000 mg | ORAL_CAPSULE | Freq: Every day | ORAL | 2 refills | Status: AC

## 2023-11-28 NOTE — Assessment & Plan Note (Signed)
 On progesterone  and estradiol  however will likely work on d/c progesterone  and advised pt to start estradiol  twice weekly

## 2023-11-28 NOTE — Progress Notes (Signed)
 Subjective:  Patient ID: Penny Hensley, female    DOB: [birth year]  Age: 63 y.o. MRN: [id number]  Patient Care Team: Corwin Antu, FNP as PCP - General (Family Medicine)   CC:  Chief Complaint  Patient presents with   Annual Exam    HPI Penny Hensley is a 63 y.o. female who presents today for an annual physical exam. She reports consuming a general diet. The patient does not participate in regular exercise at present. She generally feels well. She reports sleeping well. She does not have additional problems to discuss today. Trying to work on walking more now that the weather is getting better.   Here also today for pap. No vaginal discharge, pain, some pain with sex. No nipple changes, breast discharge or breast masses.   Vision:Within last year Dental:Receives regular dental care  Mammogram: 09/19/22 Last pap: 08/03/20 every three years, due. Last one, negative Colonoscopy: cologuard, 08/15/21  Pt is without acute concerns.   Advanced Directives Patient does have advanced directives. She does not have a copy in the electronic medical record.   DEPRESSION SCREENING    11/28/2023    7:26 AM 10/26/2023    9:34 AM 02/07/2023    8:14 AM 08/08/2022    8:36 AM 02/06/2022   10:25 AM 08/05/2021    9:57 AM 02/02/2021   10:04 AM  PHQ 2/9 Scores  PHQ - 2 Score 0 0 0 0 0 0 0  PHQ- 9 Score 0 0 1 0 0 0 0     ROS: Negative unless specifically indicated above in HPI.    Current Outpatient Medications:    B Complex-C (SUPER B COMPLEX/VITAMIN C) TABS, Take 1 tablet by mouth daily., Disp: , Rfl:    Black Pepper-Turmeric (TURMERIC COMPLEX/BLACK PEPPER) 3-500 MG CAPS, , Disp: , Rfl:    calcium citrate (CALCITRATE - DOSED IN MG ELEMENTAL CALCIUM) 950 (200 Ca) MG tablet, Take 1 tablet by mouth daily., Disp: , Rfl:    Cyanocobalamin  (B-12) 500 MCG TABS, Take 1 tablet by mouth daily., Disp: , Rfl:    fluocinonide  (LIDEX ) 0.05 % external solution, Apply 1 Application topically 2  (two) times daily., Disp: 180 mL, Rfl: 3   furosemide  (LASIX ) 20 MG tablet, Take 1 tablet (20 mg total) by mouth daily as needed., Disp: 90 tablet, Rfl: 3   ibuprofen  (ADVIL ) 800 MG tablet, Take 1 tablet (800 mg total) by mouth every 8 (eight) hours as needed., Disp: 90 tablet, Rfl: 2   Magnesium 500 MG CAPS, , Disp: , Rfl:    Multiple Vitamin (MULTI-VITAMINS) TABS, Take by mouth., Disp: , Rfl:    Omega-3 Fatty Acids (FISH OIL) 1200 MG CAPS, Take 1 capsule by mouth daily., Disp: , Rfl:    omeprazole  (PRILOSEC) 20 MG capsule, Take 1 capsule (20 mg total) by mouth daily., Disp: 30 capsule, Rfl: 0   pravastatin  (PRAVACHOL ) 20 MG tablet, Take 1 tablet (20 mg total) by mouth daily., Disp: 90 tablet, Rfl: 1   spironolactone (ALDACTONE) 25 MG tablet, Take 50 mg by mouth daily., Disp: , Rfl:    terbinafine  (LAMISIL ) 250 MG tablet, Take 1 tablet (250 mg total) by mouth daily., Disp: 90 tablet, Rfl: 0   [START ON 11/29/2023] estradiol  (ESTRACE ) 0.1 MG/GM vaginal cream, Place 1 Applicatorful vaginally 2 (two) times a week., Disp: 42.5 g, Rfl: 2   progesterone  (PROMETRIUM ) 100 MG capsule, Take 1 capsule (100 mg total) by mouth at bedtime., Disp: 30 capsule, Rfl: 2  Objective:    BP 122/78 (BP Location: Left Arm, Patient Position: Sitting, Cuff Size: Normal)   Pulse 70   Temp 98.6 F (37 C) (Temporal)   Ht 5' 10 (1.778 m)   Wt 190 lb (86.2 kg)   SpO2 96%   BMI 27.26 kg/m   BP Readings from Last 3 Encounters:  11/28/23 122/78  10/26/23 138/84  09/03/23 126/68      Physical Exam Constitutional:      General: She is not in acute distress.    Appearance: Normal appearance. She is normal weight. She is not ill-appearing.  HENT:     Head: Normocephalic.     Right Ear: Tympanic membrane normal.     Left Ear: Tympanic membrane normal.     Nose: Nose normal.     Mouth/Throat:     Mouth: Mucous membranes are moist.  Eyes:     Extraocular Movements: Extraocular movements intact.     Pupils:  Pupils are equal, round, and reactive to light.  Cardiovascular:     Rate and Rhythm: Normal rate and regular rhythm.  Pulmonary:     Effort: Pulmonary effort is normal.     Breath sounds: Normal breath sounds.  Abdominal:     General: Abdomen is flat. Bowel sounds are normal.     Palpations: Abdomen is soft.     Tenderness: There is no guarding or rebound.  Genitourinary:    General: Normal vulva.     Labia:        Right: No rash or tenderness.        Left: No rash or tenderness.      Urethra: No prolapse.     Vagina: Normal.     Cervix: Normal.     Adnexa:        Right: No tenderness.         Left: No tenderness.       Rectum: Normal.  Musculoskeletal:        General: Normal range of motion.     Cervical back: Normal range of motion.  Skin:    General: Skin is warm.     Capillary Refill: Capillary refill takes less than 2 seconds.  Neurological:     General: No focal deficit present.     Mental Status: She is alert.  Psychiatric:        Mood and Affect: Mood normal.        Behavior: Behavior normal.        Thought Content: Thought content normal.        Judgment: Judgment normal.     Lab Results  Component Value Date   WBC 6.3 10/26/2023   HGB 15.4 (H) 10/26/2023   HCT 45.3 10/26/2023   MCV 90.5 10/26/2023   PLT 274.0 10/26/2023        Assessment & Plan:  Vaginal atrophy Assessment & Plan: On progesterone  and estradiol  however will likely work on d/c progesterone  and advised pt to start estradiol  twice weekly   Orders: -     Estradiol ; Place 1 Applicatorful vaginally 2 (two) times a week.  Dispense: 42.5 g; Refill: 2 -     Progesterone ; Take 1 capsule (100 mg total) by mouth at bedtime.  Dispense: 30 capsule; Refill: 2  Screening mammogram for breast cancer -     3D Screening Mammogram, Left and Right; Future  Encounter for Papanicolaou smear of vagina as part of routine gynecological examination Assessment & Plan: Pt verbalized consent for pelvic  exam Declines std testing hpv thin prep ordered and pending results Pap exam in office completed, pt tolerated well.   Patient Counseling(The following topics were reviewed):  Preventative care handout given to pt  Health maintenance and immunizations reviewed. Please refer to Health maintenance section. Pt advised on safe sex, wearing seatbelts in car, and proper nutrition labwork ordered today for annual Dental health: Discussed importance of regular tooth brushing, flossing, and dental visits.   Orders: -     Cytology - PAP  Pap smear for cervical cancer screening -     Cytology - PAP  Mixed hyperlipidemia Assessment & Plan: Work on low cholesterol diet and exercise as tolerated Continue pravastatin  20 mg nightly       Follow-up: Return in about 6 months (around 05/27/2024) for f/u cholesterol.   Ginger Patrick, FNP

## 2023-11-28 NOTE — Patient Instructions (Signed)
  I have sent an electronic order over to your preferred location for the following:   []   2D Mammogram  [x]   3D Mammogram  []   Bone Density   Please give this center a call to get scheduled at your convenience.  [x]   [facility]  [address] [place] [address]  [phone]  Make sure to wear two piece  clothing  No lotions powders or deodorants the day of the appointment Make sure to bring picture ID and insurance card.  Bring list of medications you are currently taking including any supplements.   ------------------------------------

## 2023-11-28 NOTE — Assessment & Plan Note (Signed)
 Work on low cholesterol diet and exercise as tolerated Continue pravastatin  20 mg nightly

## 2023-11-28 NOTE — Assessment & Plan Note (Signed)
 Pt verbalized consent for pelvic exam Declines std testing hpv thin prep ordered and pending results Pap exam in office completed, pt tolerated well.   Patient Counseling(The following topics were reviewed):  Preventative care handout given to pt  Health maintenance and immunizations reviewed. Please refer to Health maintenance section. Pt advised on safe sex, wearing seatbelts in car, and proper nutrition labwork ordered today for annual Dental health: Discussed importance of regular tooth brushing, flossing, and dental visits.

## 2023-11-30 ENCOUNTER — Encounter: Payer: BC Managed Care – PPO | Admitting: Family

## 2023-12-03 LAB — CYTOLOGY - PAP
Comment: NEGATIVE
Diagnosis: NEGATIVE
High risk HPV: NEGATIVE

## 2023-12-04 ENCOUNTER — Encounter: Payer: Self-pay | Admitting: Family

## 2023-12-25 ENCOUNTER — Ambulatory Visit
Admission: RE | Admit: 2023-12-25 | Discharge: 2023-12-25 | Disposition: A | Discharge status: Home / Self Care | Source: Ambulatory Visit | Attending: Family | Admitting: Family

## 2023-12-25 DIAGNOSIS — Z1231 Encounter for screening mammogram for malignant neoplasm of breast: Secondary | ICD-10-CM | POA: Diagnosis present

## 2023-12-28 ENCOUNTER — Encounter: Payer: Self-pay | Admitting: Family

## 2023-12-31 ENCOUNTER — Encounter: Payer: BC Managed Care – PPO | Admitting: Family

## 2024-01-02 ENCOUNTER — Encounter: Payer: Self-pay | Admitting: Podiatry

## 2024-01-02 ENCOUNTER — Ambulatory Visit: Payer: BC Managed Care – PPO | Admitting: Podiatry

## 2024-01-02 DIAGNOSIS — B351 Tinea unguium: Secondary | ICD-10-CM

## 2024-01-02 MED ORDER — TERBINAFINE HCL 250 MG PO TABS
250.0000 mg | ORAL_TABLET | Freq: Every day | ORAL | 0 refills | Status: AC
Start: 2024-01-02 — End: 2024-04-01

## 2024-01-02 NOTE — Progress Notes (Signed)
  Subjective:  Patient ID: Penny Hensley, female    DOB: 1961-08-29,  MRN: [id number]  Chief Complaint  Patient presents with   Nail Problem    "They're better.  They still have some issues but better.  They're growing out so that's good."    63 y.o. female presents with the above complaint. History confirmed with patient.  Thanks progress after  Objective:  Physical Exam: warm, good capillary refill, no trophic changes or ulcerative lesions, normal DP and PT pulses, normal sensory exam, and onychomycosis left hallux and right third toe improving with proximal clearing about 20% improvement.      Assessment:   1. Onychomycosis      Plan:  Patient was evaluated and treated and all questions answered.  Showing improvement so far Lamisil therapy had no adverse effects taking it.  She finished her course last week.  Would like her to restart this in 2 weeks.  Do not expect major side effects to develop at this point but she will let me know if this develops.  Okay to proceed with pedicures and nail polish advised to remove nail polish before next visit.  Return to see me in 4 months to reevaluate.  Return in about 4 months (around 05/03/2024) for follow up after nail fungus treatment.

## 2024-02-01 ENCOUNTER — Encounter: Payer: Self-pay | Admitting: Family

## 2024-03-11 ENCOUNTER — Other Ambulatory Visit: Payer: Self-pay | Admitting: Nurse Practitioner

## 2024-03-11 ENCOUNTER — Encounter: Payer: Self-pay | Admitting: Family

## 2024-03-11 DIAGNOSIS — E782 Mixed hyperlipidemia: Secondary | ICD-10-CM

## 2024-03-12 MED ORDER — PRAVASTATIN SODIUM 20 MG PO TABS: 20.0000 mg | ORAL_TABLET | Freq: Every day | ORAL | 1 refills | Status: DC

## 2024-05-07 ENCOUNTER — Ambulatory Visit: Admitting: Podiatry

## 2024-05-07 ENCOUNTER — Encounter: Payer: Self-pay | Admitting: Podiatry

## 2024-05-07 VITALS — Ht 70.0 in | Wt 190.0 lb

## 2024-05-07 DIAGNOSIS — B351 Tinea unguium: Secondary | ICD-10-CM

## 2024-05-07 DIAGNOSIS — B070 Plantar wart: Secondary | ICD-10-CM

## 2024-05-07 MED ORDER — TERBINAFINE HCL 250 MG PO TABS: 250.0000 mg | ORAL_TABLET | Freq: Every day | ORAL | 0 refills | Status: AC

## 2024-05-07 NOTE — Progress Notes (Signed)
  Subjective:  Patient ID: Penny Hensley, female    DOB: 06-01-1961,  MRN: [id number]  Chief Complaint  Patient presents with   Nail Problem    Rm Patient is here as follow-up nail fungus. Patient is happy with progress, Patient is also concerned about a wart that is located on the bottom of left foot. Patient states wart was recently treated at dermatologist office on 04/08/24.    63 y.o. female presents with the above complaint. History confirmed with patient.   Objective:  Physical Exam: warm, good capillary refill, no trophic changes or ulcerative lesions, normal DP and PT pulses, normal sensory exam, and onychomycosis bilateral hallux is approximately two thirds clearance, verruca plantaris left foot submetatarsal 1 and mid arch.  Assessment:   1. Onychomycosis   2. Verruca plantaris      Plan:  Patient was evaluated and treated and all questions answered.  Nail fungus continues to improve.  I did recommend another round of Lamisil .  This should take her to a years worth of treatment and should be sufficient after that.  Follow-up in 4 months to reevaluate nail fungus treatment  Discussed etiology and treatment of verruca plantaris in detail with the patient as well as multiple treatment options including blistering agents, chemotherapeutic agents, surgical excision, laser therapy and the indications and roles of the above.  Today, recommended treatment with Cantharone as noted in procedure note below.  Follow-up in 3 weeks for reevaluation  Procedure: Destruction of Lesion Location: Submetatarsal 1 left foot Instrumentation: 15 blade. Technique: Debridement of lesion to petechial bleeding.Small amount of canthrone applied to the base of the lesion. Dressing: Adhesive bandage Disposition: Patient tolerated procedure well. Advised to leave dressing on for 24 hours. Thereafter patient to wash the area with soap and water and applied band-aid. Patient to return in 3 weeks for  follow-up.   No follow-ups on file.

## 2024-05-09 ENCOUNTER — Telehealth: Payer: Self-pay

## 2024-05-09 NOTE — Telephone Encounter (Signed)
 Copied from CRM [id number]. Topic: Clinical - Medical Advice >> May 09, 2024  3:45 PM Harlene ORN wrote: Reason for CRM:  Patient called about her insurance insurance. She did 4 blood tests on 01/03. However, one of the tests was not properly coded, leaving her with a bill. Please advise.

## 2024-05-12 NOTE — Telephone Encounter (Signed)
 Called and spoke with patient, answered all questions. Nothing further needed at this time.

## 2024-05-27 ENCOUNTER — Ambulatory Visit: Payer: BC Managed Care – PPO | Admitting: Family

## 2024-05-27 ENCOUNTER — Ambulatory Visit: Payer: Self-pay | Admitting: Family

## 2024-05-27 ENCOUNTER — Encounter: Payer: Self-pay | Admitting: Family

## 2024-05-27 VITALS — BP 102/66 | HR 59 | Temp 98.0°F | Ht 70.0 in | Wt 181.4 lb

## 2024-05-27 DIAGNOSIS — M545 Low back pain, unspecified: Secondary | ICD-10-CM | POA: Insufficient documentation

## 2024-05-27 DIAGNOSIS — E782 Mixed hyperlipidemia: Secondary | ICD-10-CM | POA: Diagnosis not present

## 2024-05-27 DIAGNOSIS — Z79899 Other long term (current) drug therapy: Secondary | ICD-10-CM

## 2024-05-27 DIAGNOSIS — R600 Localized edema: Secondary | ICD-10-CM

## 2024-05-27 DIAGNOSIS — N952 Postmenopausal atrophic vaginitis: Secondary | ICD-10-CM

## 2024-05-27 LAB — COMPREHENSIVE METABOLIC PANEL WITH GFR
ALT: 14 U/L (ref 0–35)
AST: 15 U/L (ref 0–37)
Albumin: 4.6 g/dL (ref 3.5–5.2)
Alkaline Phosphatase: 41 U/L (ref 39–117)
BUN: 18 mg/dL (ref 6–23)
CO2: 30 meq/L (ref 19–32)
Calcium: 9.3 mg/dL (ref 8.4–10.5)
Chloride: 104 meq/L (ref 96–112)
Creatinine, Ser: 1.03 mg/dL (ref 0.40–1.20)
GFR: 57.86 mL/min — ABNORMAL LOW (ref >60.00)
Glucose, Bld: 89 mg/dL (ref 70–99)
Potassium: 4.3 meq/L (ref 3.5–5.1)
Sodium: 141 meq/L (ref 135–145)
Total Bilirubin: 0.5 mg/dL (ref 0.2–1.2)
Total Protein: 6.8 g/dL (ref 6.0–8.3)

## 2024-05-27 LAB — LIPID PANEL
Cholesterol: 223 mg/dL — ABNORMAL HIGH (ref 0–200)
HDL: 49.9 mg/dL (ref >39.00)
LDL Cholesterol: 138 mg/dL — ABNORMAL HIGH (ref 0–99)
NonHDL: 173.24
Total CHOL/HDL Ratio: 4
Triglycerides: 175 mg/dL — ABNORMAL HIGH (ref 0.0–149.0)
VLDL: 35 mg/dL (ref 0.0–40.0)

## 2024-05-27 MED ORDER — PREDNISONE 10 MG (21) PO TBPK
ORAL_TABLET | ORAL | 0 refills | Status: AC
Start: 2024-05-27 — End: ?

## 2024-05-27 MED ORDER — FUROSEMIDE 20 MG PO TABS: 20.0000 mg | ORAL_TABLET | Freq: Every day | ORAL | 3 refills | Status: AC | PRN

## 2024-05-27 MED ORDER — TIZANIDINE HCL 4 MG PO TABS: 4.0000 mg | ORAL_TABLET | Freq: Every day | ORAL | 0 refills | Status: AC

## 2024-05-27 NOTE — Assessment & Plan Note (Signed)
 Overall stable.  Furosemide  refilled and use as needed

## 2024-05-27 NOTE — Assessment & Plan Note (Signed)
 Tizanidine  4 mg at bedtime prn muscle spasm  Rx prednisone  pack take as directed Low back exercises Heat to site.

## 2024-05-27 NOTE — Progress Notes (Signed)
 Established Patient Office Visit  Subjective:      CC:  Chief Complaint  Patient presents with   Medical Management of Chronic Issues    HPI: Penny Hensley is a 63 y.o. female presenting on 05/27/2024 for Medical Management of Chronic Issues  Pinched a nerve in her lower back, midline, not worse with walking. Worse in the am and after moving around there is some improvement. Taking ibuprofen  just to help with the tenderness. She knew a moment when pulling weeds a few weeks back when she noticed the twinge. Will notice it upon standing up more dull achy than not.   HRT: stopped with hormonal supplementations and is working on d/c her progesterone . She states she has not had side effects with weaning down. Was taking spironolactone for hirsutism per [facility] but she is planning to d/c this as well because she hasn't noticed as much of an issue.   Pedal edema, takes furosemide  only a few times a month when she notices her feet are 'puffed up' after sitting at her desk.          Social history:  Relevant past medical, surgical, family and social history reviewed and updated as indicated. Interim medical history since our last visit reviewed.  Allergies and medications reviewed and updated.  DATA REVIEWED: CHART IN EPIC     ROS: Negative unless specifically indicated above in HPI.    Current Outpatient Medications:    B Complex-C (SUPER B COMPLEX/VITAMIN C) TABS, Take 1 tablet by mouth daily., Disp: , Rfl:    Black Pepper-Turmeric (TURMERIC COMPLEX/BLACK PEPPER) 3-500 MG CAPS, , Disp: , Rfl:    calcium citrate (CALCITRATE - DOSED IN MG ELEMENTAL CALCIUM) 950 (200 Ca) MG tablet, Take 1 tablet by mouth daily., Disp: , Rfl:    Cyanocobalamin  (B-12) 500 MCG TABS, Take 1 tablet by mouth daily., Disp: , Rfl:    estradiol  (ESTRACE ) 0.1 MG/GM vaginal cream, Place 1 Applicatorful vaginally 2 (two) times a week., Disp: 42.5 g, Rfl: 2   fluocinonide  (LIDEX ) 0.05 % external  solution, Apply 1 Application topically 2 (two) times daily., Disp: 180 mL, Rfl: 3   ibuprofen  (ADVIL ) 800 MG tablet, Take 1 tablet (800 mg total) by mouth every 8 (eight) hours as needed., Disp: 90 tablet, Rfl: 2   Magnesium 500 MG CAPS, , Disp: , Rfl:    Multiple Vitamin (MULTI-VITAMINS) TABS, Take by mouth., Disp: , Rfl:    Omega-3 Fatty Acids (FISH OIL) 1200 MG CAPS, Take 1 capsule by mouth daily., Disp: , Rfl:    omeprazole  (PRILOSEC) 20 MG capsule, Take 1 capsule (20 mg total) by mouth daily., Disp: 30 capsule, Rfl: 0   pravastatin  (PRAVACHOL ) 20 MG tablet, Take 1 tablet (20 mg total) by mouth daily., Disp: 90 tablet, Rfl: 1   predniSONE  (STERAPRED UNI-PAK 21 TAB) 10 MG (21) TBPK tablet, Take as directed, Disp: 1 each, Rfl: 0   progesterone  (PROMETRIUM ) 100 MG capsule, Take 1 capsule (100 mg total) by mouth at bedtime., Disp: 30 capsule, Rfl: 2   spironolactone (ALDACTONE) 25 MG tablet, Take 50 mg by mouth daily., Disp: , Rfl:    terbinafine  (LAMISIL ) 250 MG tablet, Take 1 tablet (250 mg total) by mouth daily., Disp: 90 tablet, Rfl: 0   tiZANidine  (ZANAFLEX ) 4 MG tablet, Take 1 tablet (4 mg total) by mouth at bedtime., Disp: 30 tablet, Rfl: 0   furosemide  (LASIX ) 20 MG tablet, Take 1 tablet (20 mg total) by mouth daily  as needed., Disp: 90 tablet, Rfl: 3      Objective:    BP 102/66 (BP Location: Left Arm, Patient Position: Sitting, Cuff Size: Normal)   Pulse (!) 59   Temp 98 F (36.7 C) (Temporal)   Ht 5' 10 (1.778 m)   Wt 181 lb 6.4 oz (82.3 kg)   SpO2 95%   BMI 26.03 kg/m   Wt Readings from Last 3 Encounters:  05/27/24 181 lb 6.4 oz (82.3 kg)  05/07/24 190 lb (86.2 kg)  11/28/23 190 lb (86.2 kg)    Physical Exam Vitals reviewed.  Constitutional:      General: She is not in acute distress.    Appearance: Normal appearance. She is normal weight. She is not ill-appearing, toxic-appearing or diaphoretic.  HENT:     Head: Normocephalic.  Cardiovascular:     Rate and  Rhythm: Normal rate.  Pulmonary:     Effort: Pulmonary effort is normal.  Musculoskeletal:     Lumbar back: Spasms and tenderness present. No bony tenderness. Decreased range of motion. Negative right straight leg raise test and negative left straight leg raise test.     Right lower leg: No edema.     Left lower leg: No edema.  Neurological:     General: No focal deficit present.     Mental Status: She is alert and oriented to person, place, and time. Mental status is at baseline.  Psychiatric:        Mood and Affect: Mood normal.        Behavior: Behavior normal.        Thought Content: Thought content normal.        Judgment: Judgment normal.           Assessment & Plan:  Acute midline low back pain without sciatica Assessment & Plan: Tizanidine  4 mg at bedtime prn muscle spasm  Rx prednisone  pack take as directed Low back exercises Heat to site.    Orders: -     tiZANidine  HCl; Take 1 tablet (4 mg total) by mouth at bedtime.  Dispense: 30 tablet; Refill: 0 -     predniSONE ; Take as directed  Dispense: 1 each; Refill: 0  Edema leg -     Furosemide ; Take 1 tablet (20 mg total) by mouth daily as needed.  Dispense: 90 tablet; Refill: 3  Mixed hyperlipidemia Assessment & Plan: Work on low cholesterol diet and exercise as tolerated Continue pravastatin  20 mg nightly Lipid panel ordered  Orders: -     Lipid panel  On statin therapy -     Comprehensive metabolic panel with GFR  Pedal edema Assessment & Plan: Overall stable.  Furosemide  refilled and use as needed   Orders: -     Comprehensive metabolic panel with GFR  Vaginal atrophy Assessment & Plan: Estradiol  twice weekly        Return in about 6 months (around 11/27/2024) for f/u CPE.  Ginger Patrick, MSN, APRN, FNP-C [facility]

## 2024-05-27 NOTE — Assessment & Plan Note (Addendum)
 Work on low cholesterol diet and exercise as tolerated Continue pravastatin  20 mg nightly Lipid panel ordered

## 2024-05-27 NOTE — Assessment & Plan Note (Signed)
 Estradiol  twice weekly

## 2024-05-28 ENCOUNTER — Ambulatory Visit: Admitting: Podiatry

## 2024-05-28 VITALS — Ht 70.0 in | Wt 181.4 lb

## 2024-05-28 DIAGNOSIS — B070 Plantar wart: Secondary | ICD-10-CM

## 2024-05-28 MED ORDER — PRAVASTATIN SODIUM 40 MG PO TABS: 40.0000 mg | ORAL_TABLET | Freq: Every day | ORAL | 3 refills | Status: AC

## 2024-05-28 NOTE — Progress Notes (Signed)
  Subjective:  Patient ID: Penny Hensley, female    DOB: February 02, 1961,  MRN: [id number]  Chief Complaint  Patient presents with   Plantar Warts    Rm Patient is here to follow-up on plantar wart of the left foot.     63 y.o. female presents with the above complaint. History confirmed with patient.   Objective:  Physical Exam: warm, good capillary refill, no trophic changes or ulcerative lesions, normal DP and PT pulses, normal sensory exam, and onychomycosis bilateral hallux is approximately two thirds clearance, verruca plantaris left foot submetatarsal 1 improved by one third  Assessment:   1. Verruca plantaris       Plan:  Patient was evaluated and treated and all questions answered.    Discussed etiology and treatment of verruca plantaris in detail with the patient as well as multiple treatment options including blistering agents, chemotherapeutic agents, surgical excision, laser therapy and the indications and roles of the above.  Today, recommended treatment with Cantharone as noted in procedure note below.  Follow-up in 3 weeks for reevaluation  Procedure: Destruction of Lesion Location: Submetatarsal 1 left foot Instrumentation: 15 blade. Technique: Debridement of lesion to petechial bleeding.Small amount of canthrone applied to the base of the lesion. Dressing: Adhesive bandage Disposition: Patient tolerated procedure well. Advised to leave dressing on for at least 24 hours. Thereafter patient to wash the area with soap and water and applied band-aid. Patient to return in 3 weeks for follow-up.   Return in about 3 weeks (around 06/18/2024) for wart treatment.

## 2024-06-18 ENCOUNTER — Ambulatory Visit: Admitting: Podiatry

## 2024-07-01 ENCOUNTER — Telehealth: Payer: Self-pay | Admitting: Family

## 2024-07-01 NOTE — Telephone Encounter (Signed)
 Pt has bill for the ccp peptide on the [employer] cascade test for 157$   Can we contact the [employer] rep in regards to coverage? They had said there should be no issues, and to contact if any issues?   Let me know if you can help with this Paperwork in outbox

## 2024-07-02 NOTE — Telephone Encounter (Signed)
 Called and added the new DX code M25.50 and called and let the patient know what was going on. Pt went ahead and paid bill because it was going to go to collections but is hoping it will be refunded after new submission of new code.

## 2024-07-07 ENCOUNTER — Ambulatory Visit (INDEPENDENT_AMBULATORY_CARE_PROVIDER_SITE_OTHER): Admitting: Podiatry

## 2024-07-07 DIAGNOSIS — B070 Plantar wart: Secondary | ICD-10-CM | POA: Diagnosis not present

## 2024-07-10 NOTE — Progress Notes (Signed)
  Subjective:  Patient ID: Penny Hensley, female    DOB: [birth year],  MRN: [id number]  Chief Complaint  Patient presents with   Routine Post Op    f/u left foot. No problems. Tolerating medication well. Very pleased    63 y.o. female presents with the above complaint. History confirmed with patient.   Objective:  Physical Exam: warm, good capillary refill, no trophic changes or ulcerative lesions, normal DP and PT pulses, normal sensory exam, and onychomycosis bilateral hallux is approximately two thirds clearance, verruca plantaris left foot has resolved  Assessment:   1. Verruca plantaris        Plan:  Patient was evaluated and treated and all questions answered.  Doing well lesion has resolved.  Monitor closely for recurrence.  Return in few months to reevaluate onychomycosis  Return in about 2 months (around 09/06/2024) for follow up after nail fungus treatment.

## 2024-07-24 ENCOUNTER — Other Ambulatory Visit: Payer: Self-pay | Admitting: Medical Genetics

## 2024-07-26 ENCOUNTER — Other Ambulatory Visit
Admission: RE | Admit: 2024-07-26 | Discharge: 2024-07-26 | Disposition: A | Source: Ambulatory Visit | Attending: Medical Genetics | Admitting: Medical Genetics

## 2024-08-04 LAB — GENECONNECT MOLECULAR SCREEN: Genetic Analysis Overall Interpretation: NEGATIVE

## 2024-09-10 ENCOUNTER — Ambulatory Visit: Admitting: Podiatry

## 2024-09-10 VITALS — Ht 70.0 in | Wt 181.4 lb

## 2024-09-10 DIAGNOSIS — B351 Tinea unguium: Secondary | ICD-10-CM | POA: Diagnosis not present

## 2024-09-10 MED ORDER — TERBINAFINE HCL 250 MG PO TABS: 250.0000 mg | ORAL_TABLET | Freq: Every day | ORAL | 0 refills | Status: AC

## 2024-09-10 NOTE — Progress Notes (Signed)
  Subjective:  Patient ID: Penny Hensley, female    DOB: May 20, 1961,  MRN: [id number]  Chief Complaint  Patient presents with   Nail Problem    RM 1 Patient is here to follow up nail fungus. Discoloration at tip of right and left hallux. Pt states prescription has ended, may need a refill.    63 y.o. female presents with the above complaint. History confirmed with patient.  She is doing well noting improvement in the nails again, is out of the Lamisil  at this point wart has not returned  Objective:  Physical Exam: warm, good capillary refill, no trophic changes or ulcerative lesions, normal DP and PT pulses, normal sensory exam, and onychomycosis bilateral hallux is improved greatly in the last year       Assessment:   1. Onychomycosis        Plan:  Patient was evaluated and treated and all questions answered.  continues to show improvement.  Refill of Lamisil  sent to pharmacy for a final course of this.  Discussed if there is residual mycosis at the next visit may consider a 6-month course of pulsed dosing fluconazole and/or itraconazole for alternate therapy.  Return in 4 months to reevaluate.  Photographs taken.  Return in about 4 months (around 01/08/2025) for follow up after nail fungus treatment.

## 2024-12-01 ENCOUNTER — Encounter: Admitting: Family

## 2025-01-07 ENCOUNTER — Ambulatory Visit: Admitting: Podiatry
